# Patient Record
Sex: Female | Born: 1955 | Race: White | Hispanic: No | Smoking: Never smoker
Health system: Southern US, Community
[De-identification: ages and names within clinical notes are randomized; demographics above are authoritative.]

## PROBLEM LIST (undated history)

## (undated) DIAGNOSIS — Z923 Personal history of irradiation: Secondary | ICD-10-CM

## (undated) DIAGNOSIS — D219 Benign neoplasm of connective and other soft tissue, unspecified: Secondary | ICD-10-CM

## (undated) DIAGNOSIS — C801 Malignant (primary) neoplasm, unspecified: Secondary | ICD-10-CM

## (undated) DIAGNOSIS — Z86010 Personal history of colonic polyps: Principal | ICD-10-CM

## (undated) DIAGNOSIS — C50919 Malignant neoplasm of unspecified site of unspecified female breast: Secondary | ICD-10-CM

## (undated) DIAGNOSIS — Z9221 Personal history of antineoplastic chemotherapy: Secondary | ICD-10-CM

## (undated) DIAGNOSIS — T7840XA Allergy, unspecified, initial encounter: Secondary | ICD-10-CM

## (undated) HISTORY — DX: Personal history of colonic polyps: Z86.010

## (undated) HISTORY — DX: Allergy, unspecified, initial encounter: T78.40XA

## (undated) HISTORY — DX: Malignant (primary) neoplasm, unspecified: C80.1

## (undated) HISTORY — PX: TRIGGER FINGER RELEASE: SHX641

## (undated) HISTORY — PX: COLONOSCOPY: SHX174

## (undated) HISTORY — DX: Benign neoplasm of connective and other soft tissue, unspecified: D21.9

## (undated) HISTORY — PX: BREAST BIOPSY: SHX20

## (undated) HISTORY — PX: POLYPECTOMY: SHX149

## (undated) HISTORY — DX: Malignant neoplasm of unspecified site of unspecified female breast: C50.919

---

## 1997-12-23 ENCOUNTER — Ambulatory Visit (HOSPITAL_COMMUNITY): Admission: RE | Admit: 1997-12-23 | Discharge: 1997-12-23 | Payer: Self-pay | Admitting: *Deleted

## 1998-05-27 ENCOUNTER — Other Ambulatory Visit: Admission: RE | Admit: 1998-05-27 | Discharge: 1998-05-27 | Payer: Self-pay | Admitting: *Deleted

## 1998-11-19 ENCOUNTER — Other Ambulatory Visit: Admission: RE | Admit: 1998-11-19 | Discharge: 1998-11-19 | Payer: Self-pay | Admitting: *Deleted

## 1999-01-05 ENCOUNTER — Ambulatory Visit (HOSPITAL_COMMUNITY): Admission: RE | Admit: 1999-01-05 | Discharge: 1999-01-05 | Payer: Self-pay | Admitting: *Deleted

## 1999-06-20 ENCOUNTER — Encounter: Payer: Self-pay | Admitting: Emergency Medicine

## 1999-06-20 ENCOUNTER — Emergency Department (HOSPITAL_COMMUNITY): Admission: EM | Admit: 1999-06-20 | Discharge: 1999-06-20 | Payer: Self-pay | Admitting: Emergency Medicine

## 1999-12-31 ENCOUNTER — Other Ambulatory Visit: Admission: RE | Admit: 1999-12-31 | Discharge: 1999-12-31 | Payer: Self-pay | Admitting: Obstetrics and Gynecology

## 2000-01-12 ENCOUNTER — Encounter: Payer: Self-pay | Admitting: Obstetrics and Gynecology

## 2000-01-12 ENCOUNTER — Ambulatory Visit (HOSPITAL_COMMUNITY): Admission: RE | Admit: 2000-01-12 | Discharge: 2000-01-12 | Payer: Self-pay | Admitting: Obstetrics and Gynecology

## 2000-12-19 ENCOUNTER — Ambulatory Visit (HOSPITAL_COMMUNITY): Admission: RE | Admit: 2000-12-19 | Discharge: 2000-12-19 | Payer: Self-pay | Admitting: Obstetrics and Gynecology

## 2000-12-19 ENCOUNTER — Encounter: Payer: Self-pay | Admitting: Obstetrics and Gynecology

## 2000-12-29 ENCOUNTER — Other Ambulatory Visit: Admission: RE | Admit: 2000-12-29 | Discharge: 2000-12-29 | Payer: Self-pay | Admitting: Obstetrics and Gynecology

## 2002-03-12 ENCOUNTER — Encounter: Payer: Self-pay | Admitting: Obstetrics and Gynecology

## 2002-03-12 ENCOUNTER — Ambulatory Visit: Admission: RE | Admit: 2002-03-12 | Discharge: 2002-03-12 | Payer: Self-pay | Admitting: Obstetrics and Gynecology

## 2002-03-12 ENCOUNTER — Other Ambulatory Visit: Admission: RE | Admit: 2002-03-12 | Discharge: 2002-03-12 | Payer: Self-pay | Admitting: Obstetrics and Gynecology

## 2003-03-28 ENCOUNTER — Encounter: Payer: Self-pay | Admitting: Obstetrics and Gynecology

## 2003-03-28 ENCOUNTER — Ambulatory Visit (HOSPITAL_COMMUNITY): Admission: RE | Admit: 2003-03-28 | Discharge: 2003-03-28 | Payer: Self-pay | Admitting: Obstetrics and Gynecology

## 2003-04-09 ENCOUNTER — Other Ambulatory Visit: Admission: RE | Admit: 2003-04-09 | Discharge: 2003-04-09 | Payer: Self-pay | Admitting: Obstetrics and Gynecology

## 2003-04-10 ENCOUNTER — Encounter: Payer: Self-pay | Admitting: Obstetrics and Gynecology

## 2003-04-10 ENCOUNTER — Encounter: Admission: RE | Admit: 2003-04-10 | Discharge: 2003-04-10 | Payer: Self-pay | Admitting: Obstetrics and Gynecology

## 2004-04-10 ENCOUNTER — Ambulatory Visit (HOSPITAL_COMMUNITY): Admission: RE | Admit: 2004-04-10 | Discharge: 2004-04-10 | Payer: Self-pay | Admitting: Obstetrics and Gynecology

## 2005-06-02 ENCOUNTER — Ambulatory Visit (HOSPITAL_COMMUNITY): Admission: RE | Admit: 2005-06-02 | Discharge: 2005-06-02 | Payer: Self-pay | Admitting: Obstetrics and Gynecology

## 2005-07-05 DIAGNOSIS — C50919 Malignant neoplasm of unspecified site of unspecified female breast: Secondary | ICD-10-CM

## 2005-07-05 HISTORY — PX: BREAST LUMPECTOMY: SHX2

## 2005-07-05 HISTORY — DX: Malignant neoplasm of unspecified site of unspecified female breast: C50.919

## 2006-02-07 ENCOUNTER — Encounter (INDEPENDENT_AMBULATORY_CARE_PROVIDER_SITE_OTHER): Payer: Self-pay | Admitting: *Deleted

## 2006-02-07 ENCOUNTER — Encounter: Admission: RE | Admit: 2006-02-07 | Discharge: 2006-02-07 | Payer: Self-pay | Admitting: Obstetrics and Gynecology

## 2006-02-07 ENCOUNTER — Encounter (INDEPENDENT_AMBULATORY_CARE_PROVIDER_SITE_OTHER): Payer: Self-pay | Admitting: Diagnostic Radiology

## 2006-02-08 ENCOUNTER — Encounter: Admission: RE | Admit: 2006-02-08 | Discharge: 2006-02-08 | Payer: Self-pay | Admitting: Obstetrics and Gynecology

## 2006-02-13 ENCOUNTER — Encounter: Admission: RE | Admit: 2006-02-13 | Discharge: 2006-02-13 | Payer: Self-pay | Admitting: Obstetrics and Gynecology

## 2006-02-23 ENCOUNTER — Encounter: Admission: RE | Admit: 2006-02-23 | Discharge: 2006-02-23 | Payer: Self-pay | Admitting: Surgery

## 2006-02-25 ENCOUNTER — Encounter (INDEPENDENT_AMBULATORY_CARE_PROVIDER_SITE_OTHER): Payer: Self-pay | Admitting: Specialist

## 2006-02-25 ENCOUNTER — Encounter: Admission: RE | Admit: 2006-02-25 | Discharge: 2006-02-25 | Payer: Self-pay | Admitting: Surgery

## 2006-02-25 ENCOUNTER — Ambulatory Visit (HOSPITAL_BASED_OUTPATIENT_CLINIC_OR_DEPARTMENT_OTHER): Admission: RE | Admit: 2006-02-25 | Discharge: 2006-02-25 | Payer: Self-pay | Admitting: Surgery

## 2006-03-08 ENCOUNTER — Ambulatory Visit: Payer: Self-pay | Admitting: Oncology

## 2006-03-16 LAB — COMPREHENSIVE METABOLIC PANEL
Alkaline Phosphatase: 62 U/L (ref 39–117)
BUN: 12 mg/dL (ref 6–23)
CO2: 24 mEq/L (ref 19–32)
Creatinine, Ser: 0.85 mg/dL (ref 0.40–1.20)
Glucose, Bld: 116 mg/dL — ABNORMAL HIGH (ref 70–99)
Sodium: 140 mEq/L (ref 135–145)
Total Bilirubin: 0.5 mg/dL (ref 0.3–1.2)

## 2006-03-16 LAB — CBC WITH DIFFERENTIAL/PLATELET
Basophils Absolute: 0 10*3/uL (ref 0.0–0.1)
EOS%: 0.3 % (ref 0.0–7.0)
Eosinophils Absolute: 0 10*3/uL (ref 0.0–0.5)
HGB: 13.9 g/dL (ref 11.6–15.9)
LYMPH%: 26.5 % (ref 14.0–48.0)
MCH: 33.9 pg (ref 26.0–34.0)
MCV: 95.9 fL (ref 81.0–101.0)
MONO%: 6.7 % (ref 0.0–13.0)
NEUT#: 3.2 10*3/uL (ref 1.5–6.5)
Platelets: 276 10*3/uL (ref 145–400)
RDW: 12 % (ref 11.3–14.5)

## 2006-03-16 LAB — LACTATE DEHYDROGENASE: LDH: 128 U/L (ref 94–250)

## 2006-03-22 ENCOUNTER — Ambulatory Visit: Admission: EM | Admit: 2006-03-22 | Discharge: 2006-03-22 | Payer: Self-pay | Admitting: Oncology

## 2006-03-22 ENCOUNTER — Encounter (INDEPENDENT_AMBULATORY_CARE_PROVIDER_SITE_OTHER): Payer: Self-pay | Admitting: Cardiology

## 2006-03-24 ENCOUNTER — Ambulatory Visit (HOSPITAL_COMMUNITY): Admission: RE | Admit: 2006-03-24 | Discharge: 2006-03-24 | Payer: Self-pay | Admitting: Oncology

## 2006-03-25 ENCOUNTER — Ambulatory Visit (HOSPITAL_COMMUNITY): Admission: RE | Admit: 2006-03-25 | Discharge: 2006-03-25 | Payer: Self-pay | Admitting: Oncology

## 2006-04-18 ENCOUNTER — Ambulatory Visit: Payer: Self-pay | Admitting: Oncology

## 2006-05-03 LAB — CBC WITH DIFFERENTIAL/PLATELET
Basophils Absolute: 0 10*3/uL (ref 0.0–0.1)
EOS%: 0.8 % (ref 0.0–7.0)
Eosinophils Absolute: 0 10*3/uL (ref 0.0–0.5)
HCT: 29.5 % — ABNORMAL LOW (ref 34.8–46.6)
HGB: 10.5 g/dL — ABNORMAL LOW (ref 11.6–15.9)
MCH: 34.2 pg — ABNORMAL HIGH (ref 26.0–34.0)
MCV: 96.2 fL (ref 81.0–101.0)
MONO%: 38.8 % — ABNORMAL HIGH (ref 0.0–13.0)
NEUT#: 0.2 10*3/uL — CL (ref 1.5–6.5)
NEUT%: 25.9 % — ABNORMAL LOW (ref 39.6–76.8)
lymph#: 0.2 10*3/uL — ABNORMAL LOW (ref 0.9–3.3)

## 2006-05-09 LAB — CBC WITH DIFFERENTIAL/PLATELET
Basophils Absolute: 0 10*3/uL (ref 0.0–0.1)
EOS%: 0 % (ref 0.0–7.0)
Eosinophils Absolute: 0 10*3/uL (ref 0.0–0.5)
HCT: 29.3 % — ABNORMAL LOW (ref 34.8–46.6)
HGB: 10.3 g/dL — ABNORMAL LOW (ref 11.6–15.9)
MCH: 34.6 pg — ABNORMAL HIGH (ref 26.0–34.0)
MONO#: 0.6 10*3/uL (ref 0.1–0.9)
NEUT#: 9.9 10*3/uL — ABNORMAL HIGH (ref 1.5–6.5)
NEUT%: 90.2 % — ABNORMAL HIGH (ref 39.6–76.8)
RDW: 13.9 % (ref 11.3–14.5)
WBC: 11 10*3/uL — ABNORMAL HIGH (ref 3.9–10.0)
lymph#: 0.4 10*3/uL — ABNORMAL LOW (ref 0.9–3.3)

## 2006-05-17 LAB — COMPREHENSIVE METABOLIC PANEL
Albumin: 4 g/dL (ref 3.5–5.2)
Alkaline Phosphatase: 102 U/L (ref 39–117)
BUN: 10 mg/dL (ref 6–23)
Calcium: 9.4 mg/dL (ref 8.4–10.5)
Creatinine, Ser: 0.72 mg/dL (ref 0.40–1.20)
Glucose, Bld: 96 mg/dL (ref 70–99)
Potassium: 4.3 mEq/L (ref 3.5–5.3)

## 2006-05-17 LAB — CBC WITH DIFFERENTIAL/PLATELET
Basophils Absolute: 0 10*3/uL (ref 0.0–0.1)
EOS%: 0.2 % (ref 0.0–7.0)
Eosinophils Absolute: 0 10*3/uL (ref 0.0–0.5)
HCT: 24.2 % — ABNORMAL LOW (ref 34.8–46.6)
HGB: 8.7 g/dL — ABNORMAL LOW (ref 11.6–15.9)
MCH: 32.8 pg (ref 26.0–34.0)
MCV: 90.9 fL (ref 81.0–101.0)
MONO%: 17.5 % — ABNORMAL HIGH (ref 0.0–13.0)
NEUT#: 0.2 10*3/uL — CL (ref 1.5–6.5)
NEUT%: 35.4 % — ABNORMAL LOW (ref 39.6–76.8)
Platelets: 226 10*3/uL (ref 145–400)
RDW: 13.2 % (ref 11.3–14.5)

## 2006-05-24 LAB — CBC WITH DIFFERENTIAL/PLATELET
Basophils Absolute: 0 10*3/uL (ref 0.0–0.1)
Eosinophils Absolute: 0 10*3/uL (ref 0.0–0.5)
HCT: 29.2 % — ABNORMAL LOW (ref 34.8–46.6)
HGB: 10.1 g/dL — ABNORMAL LOW (ref 11.6–15.9)
MCV: 99.7 fL (ref 81.0–101.0)
NEUT#: 11.4 10*3/uL — ABNORMAL HIGH (ref 1.5–6.5)
NEUT%: 87.5 % — ABNORMAL HIGH (ref 39.6–76.8)
RDW: 18.7 % — ABNORMAL HIGH (ref 11.3–14.5)
lymph#: 0.4 10*3/uL — ABNORMAL LOW (ref 0.9–3.3)

## 2006-05-31 LAB — CBC WITH DIFFERENTIAL/PLATELET
Basophils Absolute: 0 10*3/uL (ref 0.0–0.1)
Eosinophils Absolute: 0 10*3/uL (ref 0.0–0.5)
HCT: 34.4 % — ABNORMAL LOW (ref 34.8–46.6)
HGB: 11.7 g/dL (ref 11.6–15.9)
MONO#: 0.5 10*3/uL (ref 0.1–0.9)
NEUT#: 3 10*3/uL (ref 1.5–6.5)
NEUT%: 75.3 % (ref 39.6–76.8)
RDW: 18.4 % — ABNORMAL HIGH (ref 11.3–14.5)
WBC: 4 10*3/uL (ref 3.9–10.0)
lymph#: 0.5 10*3/uL — ABNORMAL LOW (ref 0.9–3.3)

## 2006-06-01 ENCOUNTER — Ambulatory Visit: Payer: Self-pay | Admitting: Oncology

## 2006-06-07 LAB — CBC WITH DIFFERENTIAL/PLATELET
Basophils Absolute: 0 10*3/uL (ref 0.0–0.1)
Eosinophils Absolute: 0 10*3/uL (ref 0.0–0.5)
HCT: 33.4 % — ABNORMAL LOW (ref 34.8–46.6)
HGB: 11.3 g/dL — ABNORMAL LOW (ref 11.6–15.9)
LYMPH%: 18.1 % (ref 14.0–48.0)
MCV: 98.4 fL (ref 81.0–101.0)
MONO%: 11.7 % (ref 0.0–13.0)
NEUT#: 2 10*3/uL (ref 1.5–6.5)
NEUT%: 68.4 % (ref 39.6–76.8)
Platelets: 294 10*3/uL (ref 145–400)
RBC: 3.39 10*6/uL — ABNORMAL LOW (ref 3.70–5.32)

## 2006-06-13 LAB — CBC WITH DIFFERENTIAL/PLATELET
BASO%: 0.4 % (ref 0.0–2.0)
LYMPH%: 19.4 % (ref 14.0–48.0)
MCH: 34.1 pg — ABNORMAL HIGH (ref 26.0–34.0)
MCHC: 34.4 g/dL (ref 32.0–36.0)
MCV: 99 fL (ref 81.0–101.0)
MONO%: 5.7 % (ref 0.0–13.0)
NEUT%: 72.9 % (ref 39.6–76.8)
Platelets: 261 10*3/uL (ref 145–400)
RBC: 3.56 10*6/uL — ABNORMAL LOW (ref 3.70–5.32)

## 2006-06-21 LAB — CBC WITH DIFFERENTIAL/PLATELET
BASO%: 2 % (ref 0.0–2.0)
EOS%: 1.1 % (ref 0.0–7.0)
LYMPH%: 13.7 % — ABNORMAL LOW (ref 14.0–48.0)
MCH: 33.5 pg (ref 26.0–34.0)
MCHC: 34.9 g/dL (ref 32.0–36.0)
MONO#: 0.2 10*3/uL (ref 0.1–0.9)
RBC: 3.54 10*6/uL — ABNORMAL LOW (ref 3.70–5.32)
WBC: 2.6 10*3/uL — ABNORMAL LOW (ref 3.9–10.0)
lymph#: 0.4 10*3/uL — ABNORMAL LOW (ref 0.9–3.3)

## 2006-07-04 LAB — CBC WITH DIFFERENTIAL/PLATELET
BASO%: 0.4 % (ref 0.0–2.0)
EOS%: 1 % (ref 0.0–7.0)
HCT: 33.8 % — ABNORMAL LOW (ref 34.8–46.6)
MCH: 33.8 pg (ref 26.0–34.0)
MCHC: 34.3 g/dL (ref 32.0–36.0)
MONO#: 0.1 10*3/uL (ref 0.1–0.9)
NEUT%: 76.5 % (ref 39.6–76.8)
RDW: 15 % — ABNORMAL HIGH (ref 11.3–14.5)
WBC: 2.5 10*3/uL — ABNORMAL LOW (ref 3.9–10.0)
lymph#: 0.4 10*3/uL — ABNORMAL LOW (ref 0.9–3.3)

## 2006-07-12 ENCOUNTER — Ambulatory Visit: Payer: Self-pay | Admitting: Oncology

## 2006-07-12 LAB — CBC WITH DIFFERENTIAL/PLATELET
Basophils Absolute: 0 10*3/uL (ref 0.0–0.1)
Eosinophils Absolute: 0 10*3/uL (ref 0.0–0.5)
HGB: 12.1 g/dL (ref 11.6–15.9)
LYMPH%: 20.4 % (ref 14.0–48.0)
MCV: 97.4 fL (ref 81.0–101.0)
MONO#: 0.3 10*3/uL (ref 0.1–0.9)
MONO%: 12.6 % (ref 0.0–13.0)
NEUT#: 1.6 10*3/uL (ref 1.5–6.5)
Platelets: 297 10*3/uL (ref 145–400)
WBC: 2.5 10*3/uL — ABNORMAL LOW (ref 3.9–10.0)

## 2006-07-14 ENCOUNTER — Ambulatory Visit: Admission: RE | Admit: 2006-07-14 | Discharge: 2006-09-24 | Payer: Self-pay | Admitting: *Deleted

## 2006-08-09 LAB — CBC WITH DIFFERENTIAL/PLATELET
Eosinophils Absolute: 0 10*3/uL (ref 0.0–0.5)
HCT: 36.7 % (ref 34.8–46.6)
LYMPH%: 21.7 % (ref 14.0–48.0)
MCV: 92.9 fL (ref 81.0–101.0)
MONO#: 0.3 10*3/uL (ref 0.1–0.9)
MONO%: 9.1 % (ref 0.0–13.0)
NEUT#: 1.9 10*3/uL (ref 1.5–6.5)
NEUT%: 67.1 % (ref 39.6–76.8)
Platelets: 213 10*3/uL (ref 145–400)
RBC: 3.96 10*6/uL (ref 3.70–5.32)

## 2006-08-09 LAB — COMPREHENSIVE METABOLIC PANEL
Alkaline Phosphatase: 62 U/L (ref 39–117)
BUN: 11 mg/dL (ref 6–23)
CO2: 28 mEq/L (ref 19–32)
Creatinine, Ser: 0.67 mg/dL (ref 0.40–1.20)
Glucose, Bld: 84 mg/dL (ref 70–99)
Sodium: 143 mEq/L (ref 135–145)
Total Bilirubin: 0.5 mg/dL (ref 0.3–1.2)
Total Protein: 6.7 g/dL (ref 6.0–8.3)

## 2006-08-09 LAB — CANCER ANTIGEN 27.29: CA 27.29: 22 U/mL (ref 0–39)

## 2006-10-17 ENCOUNTER — Ambulatory Visit: Payer: Self-pay | Admitting: Oncology

## 2006-10-25 IMAGING — CT CT PELVIS W/ CM
1 of 3 series · 13 of 32 positions shown, 18 images · IV contrast (omnipaque)
Comparison: PET CT from today.

CLINICAL DATA: Breast cancer.
 CHEST CT WITH CONTRAST:
TECHNIQUE: Multidetector CT imaging of the chest was performed following the standard protocol during bolus administration of intravenous contrast.
 Contrast:  125 cc Omnipaque 300.
TECHNIQUE: Multidetector CT imaging of the abdomen was performed following the standard protocol during bolus administration of intravenous contrast.
TECHNIQUE: Multidetector CT imaging of the pelvis was performed following the standard protocol during bolus administration of intravenous contrast.

[Series 2: cap 5.0 b40f st · axial · 0.68mm/px · z∈[+756,+1306]mm · 13 of 124 slices shown, 18 images]
[im 7/124  soft-tissue]
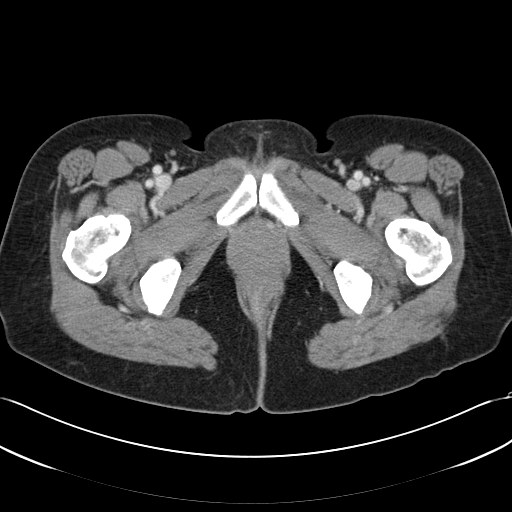
[im 7/124  bone]
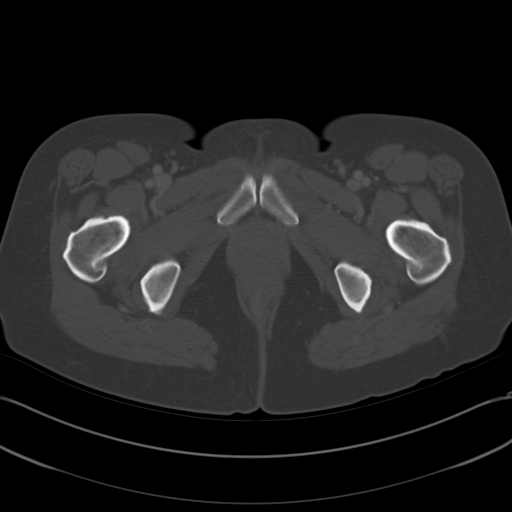
[im 21/124  soft-tissue]
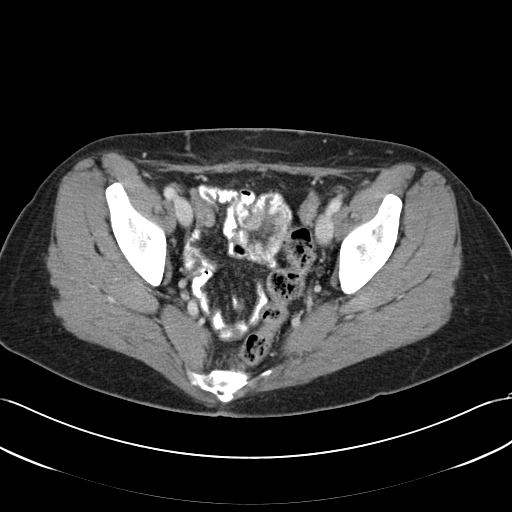
[im 28/124  soft-tissue]
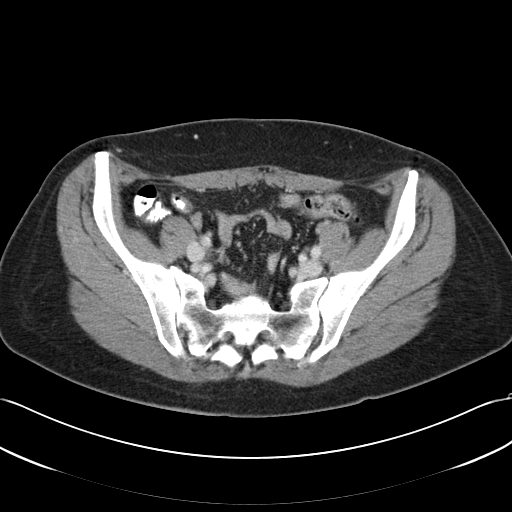
[im 35/124  soft-tissue]
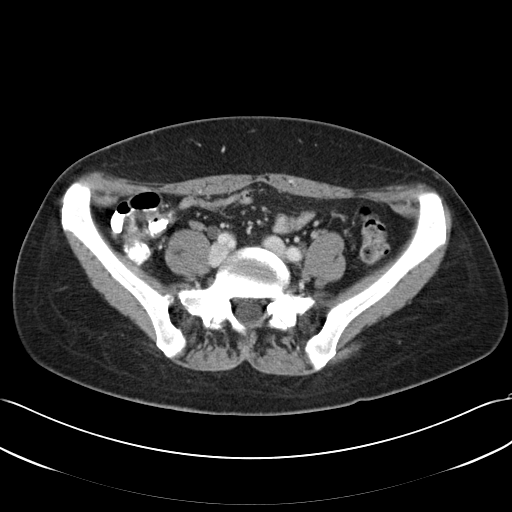
[im 48/124  soft-tissue]
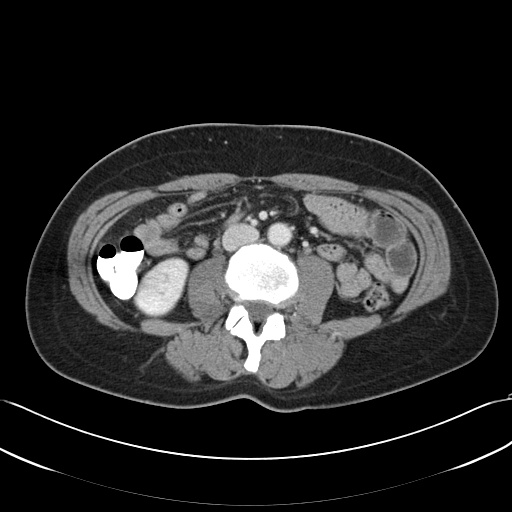
[im 55/124  soft-tissue]
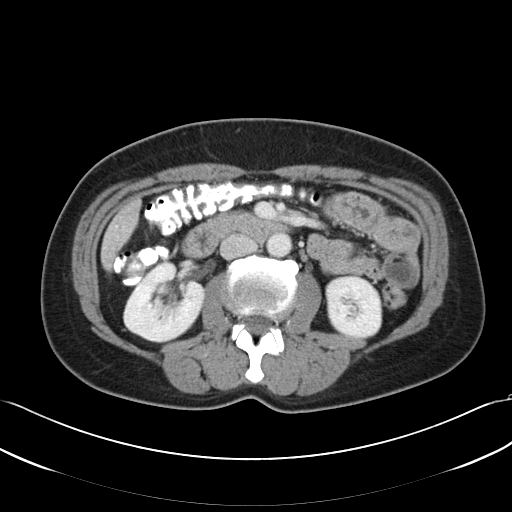
[im 69/124  soft-tissue]
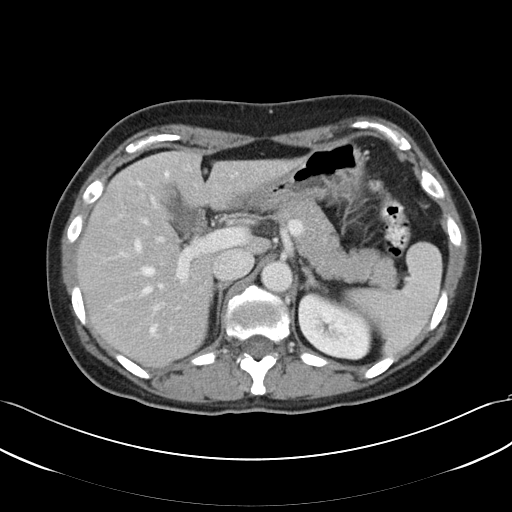
[im 76/124  soft-tissue]
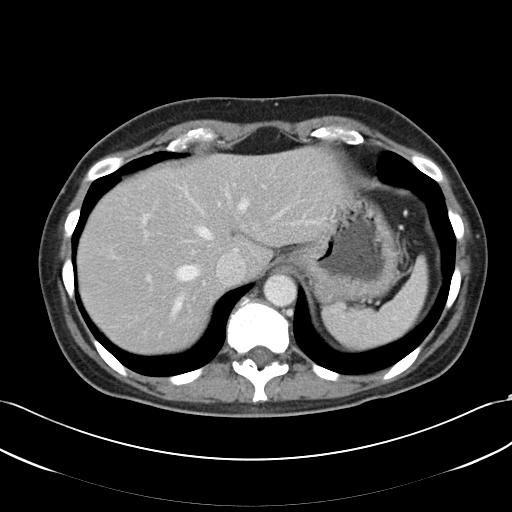
[im 89/124  soft-tissue]
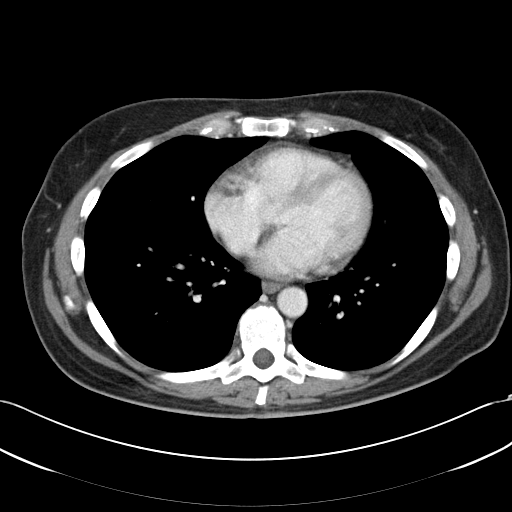
[im 89/124  bone]
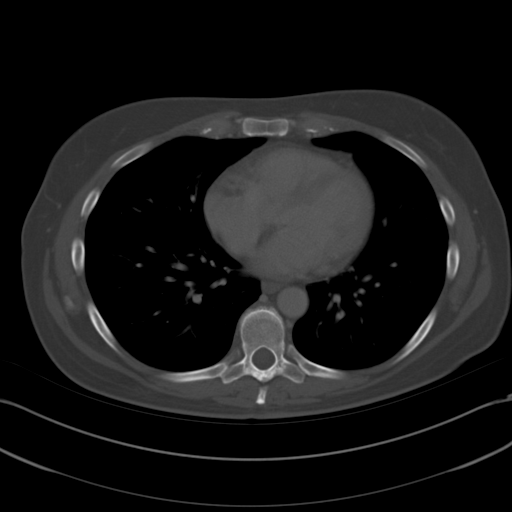
[im 96/124  soft-tissue]
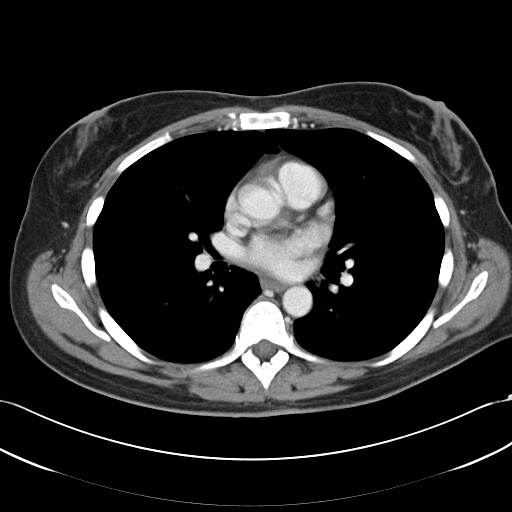
[im 96/124  lung]
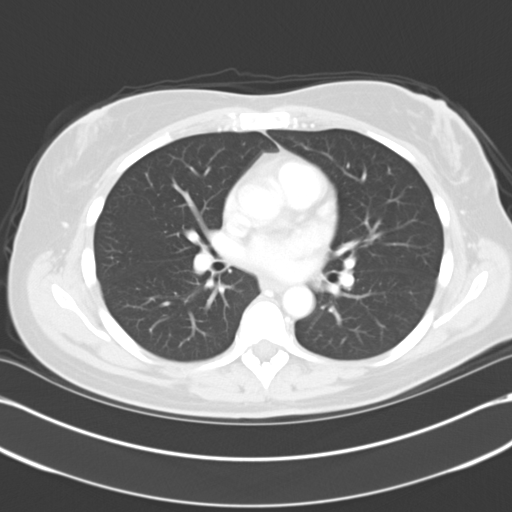
[im 103/124  soft-tissue]
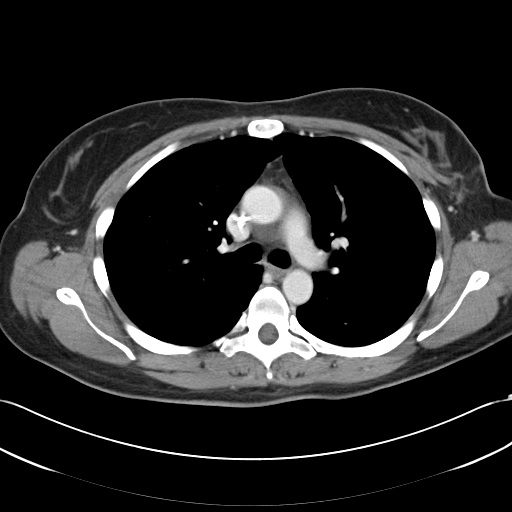
[im 103/124  lung]
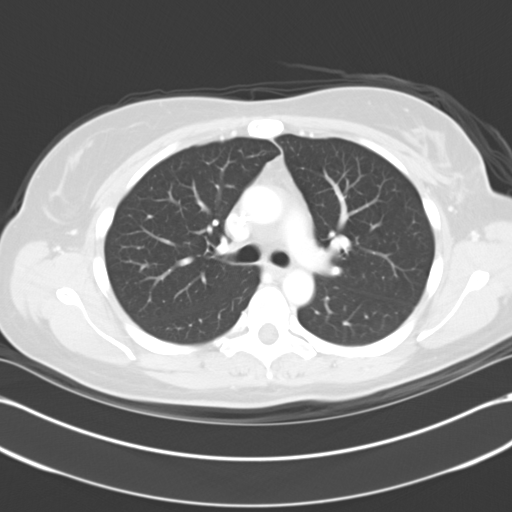
[im 110/124  lung]
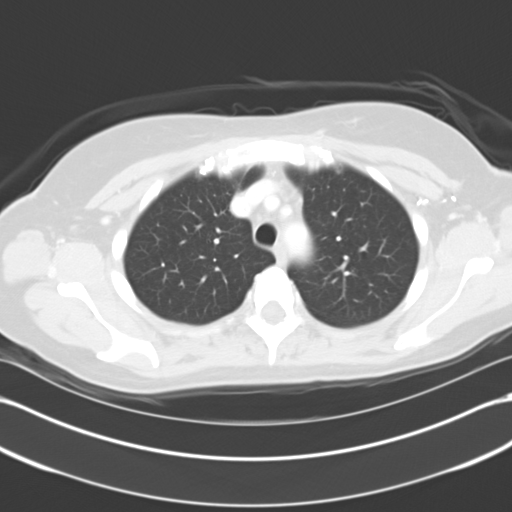
[im 117/124  soft-tissue]
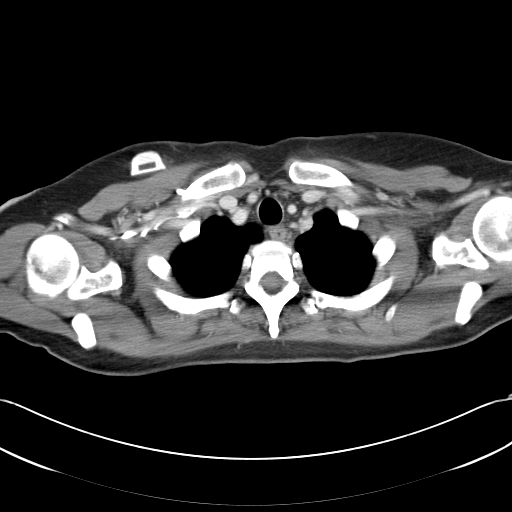
[im 117/124  lung]
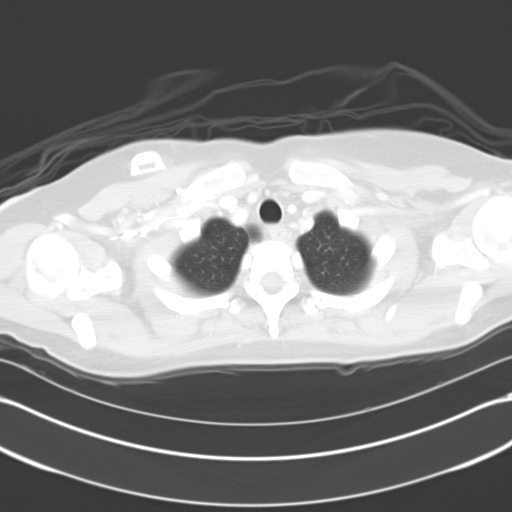

[13 of 32 positions shown; findings below may reference images not displayed]

FINDINGS: Postoperative changes in left breast and left axilla are noted.  
 There are no enlarged right axillary lymph nodes.  
 The thyroid gland is normal in appearance. 
 Negative for mediastinal or hilar lymphadenopathy.
 Negative for pleural or pericardial effusion. 
 Negative for pulmonary nodules or masses.  
 Review of the bone windows shows thoracic spondylosis.  No suspicious lesions are identified.
IMPRESSION: Expected postoperative changes in left breast and left axilla.  No evidence for metastatic disease.
 ABDOMEN CT WITH CONTRAST:
FINDINGS: 4 mm hypodensity in the left lobe of liver adjacent to falciform ligament is nonspecific (image #52).  Additional low density lesion is identified adjacent to the right hepatic vein (image #46).  This measures 5 mm.  No intrahepatic biliary ductal dilatation.
 Gallbladder negative. 
 Pancreas negative.  Spleen negative.  Adrenal glands negative.  
 Right kidney is negative.  
 Left kidney has a tiny hypodensity within the upper pole cortex measuring 5 mm, too small to characterize, but likely a cyst.  Negative for retroperitoneal or mesenteric adenopathy. 
 Bowel loops are negative.
 Bone windows show no lytic or sclerotic lesions.
IMPRESSION: 1.  No definite evidence for abdominal metastasis.
 2.  Nonspecific hypodensities in left kidney and liver are too small to characterize, but likely represent simple cysts.  
 PELVIS CT WITH CONTRAST:
FINDINGS: There are no pathologically-enlarged pelvic or inguinal lymph nodes.  
 Urinary bladder is negative.  The uterus and the adnexal structures are negative.  No free fluid or abscess formation.
 Review of the bone windows shows no lytic or sclerotic lesions.
IMPRESSION: No evidence for pelvic metastasis.

## 2006-11-22 ENCOUNTER — Ambulatory Visit (HOSPITAL_BASED_OUTPATIENT_CLINIC_OR_DEPARTMENT_OTHER): Admission: RE | Admit: 2006-11-22 | Discharge: 2006-11-22 | Payer: Self-pay | Admitting: Surgery

## 2007-02-15 ENCOUNTER — Ambulatory Visit: Payer: Self-pay | Admitting: Oncology

## 2007-02-17 ENCOUNTER — Encounter: Admission: RE | Admit: 2007-02-17 | Discharge: 2007-02-17 | Payer: Self-pay | Admitting: Oncology

## 2007-02-17 LAB — CBC WITH DIFFERENTIAL/PLATELET
BASO%: 0.3 % (ref 0.0–2.0)
Basophils Absolute: 0 10*3/uL (ref 0.0–0.1)
Eosinophils Absolute: 0 10*3/uL (ref 0.0–0.5)
HCT: 35.6 % (ref 34.8–46.6)
HGB: 13 g/dL (ref 11.6–15.9)
LYMPH%: 24.3 % (ref 14.0–48.0)
MCHC: 36.5 g/dL — ABNORMAL HIGH (ref 32.0–36.0)
MONO#: 0.3 10*3/uL (ref 0.1–0.9)
NEUT#: 2.9 10*3/uL (ref 1.5–6.5)
NEUT%: 68.6 % (ref 39.6–76.8)
Platelets: 191 10*3/uL (ref 145–400)
WBC: 4.2 10*3/uL (ref 3.9–10.0)
lymph#: 1 10*3/uL (ref 0.9–3.3)

## 2007-02-18 LAB — COMPREHENSIVE METABOLIC PANEL
AST: 16 U/L (ref 0–37)
Alkaline Phosphatase: 70 U/L (ref 39–117)
BUN: 13 mg/dL (ref 6–23)
Glucose, Bld: 86 mg/dL (ref 70–99)
Total Bilirubin: 0.7 mg/dL (ref 0.3–1.2)

## 2007-02-18 LAB — CANCER ANTIGEN 27.29: CA 27.29: 19 U/mL (ref 0–39)

## 2007-08-15 ENCOUNTER — Encounter: Admission: RE | Admit: 2007-08-15 | Discharge: 2007-08-15 | Payer: Self-pay | Admitting: Oncology

## 2007-08-23 ENCOUNTER — Ambulatory Visit: Payer: Self-pay | Admitting: Oncology

## 2007-08-28 LAB — COMPREHENSIVE METABOLIC PANEL
AST: 16 U/L (ref 0–37)
Albumin: 4.8 g/dL (ref 3.5–5.2)
Alkaline Phosphatase: 69 U/L (ref 39–117)
BUN: 14 mg/dL (ref 6–23)
Potassium: 4 mEq/L (ref 3.5–5.3)

## 2007-08-28 LAB — CBC WITH DIFFERENTIAL/PLATELET
Basophils Absolute: 0 10*3/uL (ref 0.0–0.1)
EOS%: 0.2 % (ref 0.0–7.0)
HGB: 14 g/dL (ref 11.6–15.9)
MCH: 34.2 pg — ABNORMAL HIGH (ref 26.0–34.0)
MCV: 94.8 fL (ref 81.0–101.0)
MONO%: 5.2 % (ref 0.0–13.0)
RBC: 4.09 10*6/uL (ref 3.70–5.32)
RDW: 11.7 % (ref 11.3–14.5)

## 2008-02-18 ENCOUNTER — Ambulatory Visit: Payer: Self-pay | Admitting: Oncology

## 2008-02-20 ENCOUNTER — Encounter: Admission: RE | Admit: 2008-02-20 | Discharge: 2008-02-20 | Payer: Self-pay | Admitting: Oncology

## 2008-02-22 LAB — CBC WITH DIFFERENTIAL/PLATELET
BASO%: 0.6 % (ref 0.0–2.0)
EOS%: 1 % (ref 0.0–7.0)
HGB: 12.9 g/dL (ref 11.6–15.9)
MCH: 33.8 pg (ref 26.0–34.0)
MCHC: 35.3 g/dL (ref 32.0–36.0)
RDW: 12.2 % (ref 11.3–14.5)
lymph#: 1.4 10*3/uL (ref 0.9–3.3)

## 2008-02-22 LAB — COMPREHENSIVE METABOLIC PANEL
ALT: 11 U/L (ref 0–35)
AST: 12 U/L (ref 0–37)
Albumin: 4.6 g/dL (ref 3.5–5.2)
Calcium: 9.2 mg/dL (ref 8.4–10.5)
Chloride: 103 mEq/L (ref 96–112)
Potassium: 3.8 mEq/L (ref 3.5–5.3)

## 2008-02-23 ENCOUNTER — Encounter (HOSPITAL_COMMUNITY): Admission: RE | Admit: 2008-02-23 | Discharge: 2008-03-21 | Payer: Self-pay | Admitting: Oncology

## 2009-02-18 ENCOUNTER — Ambulatory Visit: Payer: Self-pay | Admitting: Oncology

## 2009-02-20 ENCOUNTER — Encounter: Admission: RE | Admit: 2009-02-20 | Discharge: 2009-02-20 | Payer: Self-pay | Admitting: Oncology

## 2009-02-20 LAB — CBC WITH DIFFERENTIAL/PLATELET
BASO%: 0.7 % (ref 0.0–2.0)
Eosinophils Absolute: 0 10*3/uL (ref 0.0–0.5)
HCT: 36.6 % (ref 34.8–46.6)
HGB: 12.8 g/dL (ref 11.6–15.9)
MCHC: 34.8 g/dL (ref 31.5–36.0)
MONO#: 0.3 10*3/uL (ref 0.1–0.9)
NEUT#: 1.6 10*3/uL (ref 1.5–6.5)
NEUT%: 50.6 % (ref 38.4–76.8)
WBC: 3.2 10*3/uL — ABNORMAL LOW (ref 3.9–10.3)
lymph#: 1.3 10*3/uL (ref 0.9–3.3)

## 2009-02-20 LAB — COMPREHENSIVE METABOLIC PANEL
ALT: 11 U/L (ref 0–35)
CO2: 29 mEq/L (ref 19–32)
Calcium: 9.5 mg/dL (ref 8.4–10.5)
Chloride: 102 mEq/L (ref 96–112)
Creatinine, Ser: 0.72 mg/dL (ref 0.40–1.20)
Glucose, Bld: 111 mg/dL — ABNORMAL HIGH (ref 70–99)
Total Protein: 6.7 g/dL (ref 6.0–8.3)

## 2009-02-20 LAB — CANCER ANTIGEN 27.29: CA 27.29: 26 U/mL (ref 0–39)

## 2010-02-23 ENCOUNTER — Encounter: Admission: RE | Admit: 2010-02-23 | Discharge: 2010-02-23 | Payer: Self-pay | Admitting: Oncology

## 2010-02-24 ENCOUNTER — Ambulatory Visit: Payer: Self-pay | Admitting: Oncology

## 2010-02-26 LAB — COMPREHENSIVE METABOLIC PANEL
AST: 17 U/L (ref 0–37)
Albumin: 4.7 g/dL (ref 3.5–5.2)
BUN: 12 mg/dL (ref 6–23)
CO2: 23 mEq/L (ref 19–32)
Calcium: 9.2 mg/dL (ref 8.4–10.5)
Chloride: 103 mEq/L (ref 96–112)
Sodium: 140 mEq/L (ref 135–145)

## 2010-08-28 ENCOUNTER — Encounter (INDEPENDENT_AMBULATORY_CARE_PROVIDER_SITE_OTHER): Payer: Self-pay | Admitting: *Deleted

## 2010-09-01 NOTE — Letter (Signed)
Summary: Pre Visit Letter Revised  [facility]  [address] [place], [place] [address]   Phone: [phone]  Fax: [phone]        08/28/2010 MRN: [id number] Karen Graham [address] [place], [place]  [address]             Procedure Date: 10/08/2010 @ 8:30   Direct colon-Dr. Leone Payor   Welcome to the Gastroenterology Division at [facility].    You are scheduled to see a nurse for your pre-procedure visit on 09/29/2010 at 1:30 on the 3rd floor at [facility], [address]  We ask that you try to arrive at our office 15 minutes prior to your appointment time to allow for check-in.  Please take a minute to review the attached form.  If you answer "Yes" to one or more of the questions on the first page, we ask that you call the person listed at your earliest opportunity.  If you answer "No" to all of the questions, please complete the rest of the form and bring it to your appointment.    Your nurse visit will consist of discussing your medical and surgical history, your immediate family medical history, and your medications.   If you are unable to list all of your medications on the form, please bring the medication bottles to your appointment and we will list them.  We will need to be aware of both prescribed and over the counter drugs.  We will need to know exact dosage information as well.    Please be prepared to read and sign documents such as consent forms, a financial agreement, and acknowledgement forms.  If necessary, and with your consent, a friend or relative is welcome to sit-in on the nurse visit with you.  Please bring your insurance card so that we may make a copy of it.  If your insurance requires a referral to see a specialist, please bring your referral form from your primary care physician.  No co-pay is required for this nurse visit.     If you cannot keep your appointment, please call [phone] to cancel or reschedule prior to your  appointment date.  This allows [place] the opportunity to schedule an appointment for another patient in need of care.    Thank you for choosing [facility] for your medical needs.  We appreciate the opportunity to care for you.  Please visit [place] at our website  to learn more about our practice.  Sincerely, The Gastroenterology Division

## 2010-09-29 ENCOUNTER — Ambulatory Visit (AMBULATORY_SURGERY_CENTER): Payer: BC Managed Care – PPO

## 2010-09-29 VITALS — Ht 66.0 in | Wt 145.4 lb

## 2010-09-29 DIAGNOSIS — Z1211 Encounter for screening for malignant neoplasm of colon: Secondary | ICD-10-CM

## 2010-09-29 DIAGNOSIS — R110 Nausea: Secondary | ICD-10-CM

## 2010-10-01 ENCOUNTER — Telehealth: Payer: Self-pay | Admitting: Internal Medicine

## 2010-10-01 MED ORDER — PEG-KCL-NACL-NASULF-NA ASC-C 100 G PO SOLR: 1.0000 | Freq: Once | ORAL | Status: AC

## 2010-10-01 NOTE — Telephone Encounter (Signed)
Rx for Moviprep sent to [facility] on [address].  Patient notified.

## 2010-10-05 DIAGNOSIS — Z860101 Personal history of adenomatous and serrated colon polyps: Secondary | ICD-10-CM

## 2010-10-05 DIAGNOSIS — Z86010 Personal history of colonic polyps: Secondary | ICD-10-CM

## 2010-10-05 HISTORY — DX: Personal history of adenomatous and serrated colon polyps: Z86.0101

## 2010-10-05 HISTORY — DX: Personal history of colonic polyps: Z86.010

## 2010-10-07 ENCOUNTER — Encounter: Payer: Self-pay | Admitting: Internal Medicine

## 2010-10-08 ENCOUNTER — Ambulatory Visit (AMBULATORY_SURGERY_CENTER): Payer: BC Managed Care – PPO | Admitting: Internal Medicine

## 2010-10-08 ENCOUNTER — Encounter: Payer: Self-pay | Admitting: Internal Medicine

## 2010-10-08 VITALS — BP 143/80 | HR 100 | Temp 98.4°F | Resp 18 | Ht 66.0 in | Wt 143.0 lb

## 2010-10-08 DIAGNOSIS — D126 Benign neoplasm of colon, unspecified: Secondary | ICD-10-CM

## 2010-10-08 DIAGNOSIS — K644 Residual hemorrhoidal skin tags: Secondary | ICD-10-CM

## 2010-10-08 DIAGNOSIS — K5730 Diverticulosis of large intestine without perforation or abscess without bleeding: Secondary | ICD-10-CM

## 2010-10-08 DIAGNOSIS — K635 Polyp of colon: Secondary | ICD-10-CM

## 2010-10-08 DIAGNOSIS — Z1211 Encounter for screening for malignant neoplasm of colon: Secondary | ICD-10-CM

## 2010-10-08 MED ORDER — SODIUM CHLORIDE 0.9 % IV SOLN: 500.0000 mL | INTRAVENOUS | Status: DC

## 2010-10-08 NOTE — Progress Notes (Signed)
Diverticulosis, polyp int hemorrhoids

## 2010-10-08 NOTE — Patient Instructions (Addendum)
The colonoscopy showed one small (4mm) polyp that was removed. It also showed mild diverticulosis and small hemorrhoids.  Hemorrhoids Hemorrhoids are dilated (enlarged) veins around the rectum. Sometimes clots will form in the veins. This makes them swollen and painful. These are called thrombosed hemorrhoids. Causes of hemorrhoids include:  Pregnancy: this increases the pressure in the hemorrhoidal veins.   Constipation.   Straining to have a bowel movement.  HOME CARE INSTRUCTIONS  Eat a well balanced diet and drink 6 to 8 glasses of water every day to avoid constipation. You may also use a bulk laxative.   Avoid straining to have bowel movements.   Keep anal area dry and clean.   Only take over-the-counter or prescription medicines for pain, discomfort, or fever as directed by your caregiver.  If thrombosed:  Take hot sitz baths for 20 to 30 minutes, 3 to 4 times per day.   If the hemorrhoids are very tender and swollen, place ice packs on area as tolerated. Using ice packs between sitz baths may be helpful. Fill a plastic bag with ice and use a towel between the bag of ice and your skin.   Special creams and suppositories (Anusol, Nupercainal, Wyanoids) may be used or applied as directed.   Do not use a donut shaped pillow or sit on the toilet for long periods. This increases blood pooling and pain.   Move your bowels when your body has the urge; this will require less straining and will decrease pain and pressure.   Only take over-the-counter or prescription medicines for pain, discomfort, or fever as directed by your caregiver.  SEEK MEDICAL CARE IF:  You have increasing pain and swelling that is not controlled with your prescription.   You have uncontrolled bleeding.   You have an inability or difficulty having a bowel movement.   You have pain or inflammation outside the area of the hemorrhoids.   You have chills and/or an oral temperature above 100.5 F that lasts  for 2 days or longer, or as your caregiver suggests.  MAKE SURE YOU:   Understand these instructions.   Will watch your condition.   Will get help right away if you are not doing well or get worse.  Document Released: 06/18/2000 Document Re-Released: 06/03/2008 [employer] Patient Information 2011 [employer]hoids.Diverticulosis Diverticulosis is a common condition that develops when small pouches (diverticula) form in the wall of the colon. The risk of diverticulosis increases with age. It happens more often in people who eat a low-fiber diet. Most individuals with diverticulosis have no symptoms. Those individuals with symptoms usually experience belly (abdominal) pain, constipation, or loose stools (diarrhea). HOME CARE INSTRUCTIONS  Increase the amount of fiber in your diet as directed by your caregiver or dietician. This may reduce symptoms of diverticulosis.   Your caregiver may recommend taking a dietary fiber supplement.   Drink at least 6 to 8 glasses of water each day to prevent constipation.   Try not to strain when you have a bowel movement.   Your caregiver may recommend avoiding nuts and seeds to prevent complications, although this is still an uncertain benefit.   Only take over-the-counter or prescription medicines for pain, discomfort, or fever as directed by your caregiver.  FOODS HAVING HIGH FIBER CONTENT INCLUDE:  Fruits. Apple, peach, pear, tangerine, raisins, prunes.   Vegetables. Brussels sprouts, asparagus, broccoli, cabbage, carrot, cauliflower, romaine lettuce, spinach, summer squash, tomato, winter squash, zucchini.   Starchy Vegetables. Baked beans, kidney beans, lima beans, split  peas, lentils, potatoes (with skin).   Grains. Whole wheat bread, brown rice, bran flake cereal, plain oatmeal, white rice, shredded wheat, bran muffins.  SEEK IMMEDIATE MEDICAL CARE IF:  You develop increasing pain or severe bloating.   You have an oral temperature above  100.5 F, not controlled by medicine.   You develop vomiting or bowel movements that are bloody or black.  Document Released: 03/18/2004 Document Re-Released: 12/09/2009 [facility] Patient Information 2011 [place], [place].Polyps, Colon  A polyp is extra tissue that grows inside your body. Colon polyps grow in the large intestine. The large intestine, also called the colon, is part of your digestive system. It is a long, hollow tube at the end of your digestive tract where your body makes and stores stool. Most polyps are not dangerous. They are benign. This means they are not cancerous. But over time, some types of polyps can turn into cancer. Polyps that are smaller than a pea are usually not harmful. But larger polyps could someday become or may already be cancerous. To be safe, doctors remove all polyps and test them.  WHO GETS POLYPS? Anyone can get polyps, but certain people are more likely than others. You may have a greater chance of getting polyps if:  You are over 50.   You have had polyps before.   Someone in your family has had polyps.   Someone in your family has had cancer of the large intestine.   Find out if someone in your family has had polyps. You may also be more likely to get polyps if you:   Eat a lot of fatty foods   Smoke   Drink alcohol   Do not exercise  Eat too much  SYMPTOMS Most small polyps do not cause symptoms. People often do not know they have one until their caregiver finds it during a regular checkup or while testing them for something else. Some people do have symptoms like these:  Bleeding from the anus. You might notice blood on your underwear or on toilet paper after you have had a bowel movement.   Constipation or diarrhea that lasts more than a week.   Blood in the stool. Blood can make stool look black or it can show up as red streaks in the stool.  If you have any of these symptoms, see your caregiver. HOW DOES THE DOCTOR TEST FOR  POLYPS? The doctor can use four tests to check for polyps:  Digital rectal exam. The caregiver wears gloves and checks your rectum (the last part of the large intestine) to see if it feels normal. This test would find polyps only in the rectum. Your caregiver may need to do one of the other tests listed below to find polyps higher up in the intestine.   Barium enema. The caregiver puts a liquid called barium into your rectum before taking x-rays of your large intestine. Barium makes your intestine look white in the pictures. Polyps are dark, so they are easy to see.   Sigmoidoscopy. With this test, the caregiver can see inside your large intestine. A thin flexible tube is placed into your rectum. The device is called a sigmoidoscope, which has a light and a tiny video camera in it. The caregiver uses the sigmoidoscope to look at the last third of your large intestine.   Colonoscopy. This test is like sigmoidoscopy, but the caregiver looks at all of the large intestine. It usually requires sedation. This is the most common method for finding  and removing polyps.  TREATMENT  The caregiver will remove the polyp during sigmoidoscopy or colonoscopy. The polyp is then tested for cancer.   If you have had polyps, your caregiver may want you to get tested regularly in the future.  PREVENTION There is not one sure way to prevent polyps. You might be able to lower your risk of getting them if you:  Eat more fruits and vegetables and less fatty food.   Do not smoke.   Avoid alcohol.   Exercise every day.   Lose weight if you are overweight.   Eating more calcium and folate can also lower your risk of getting polyps. Some foods that are rich in calcium are milk, cheese, and broccoli. Some foods that are rich in folate are chickpeas, kidney beans, and spinach.   Aspirin might help prevent polyps. Studies are under way.  Document Released: 03/17/2004 Document Re-Released: 12/09/2009 [facility]  Patient Information 2011 [place], [place].

## 2010-10-12 ENCOUNTER — Telehealth: Payer: Self-pay | Admitting: *Deleted

## 2010-10-12 NOTE — Telephone Encounter (Signed)
NO ANSWER. LEFT MESSAGE TO CALL us IF NEEDED.

## 2010-10-19 ENCOUNTER — Encounter: Payer: Self-pay | Admitting: Internal Medicine

## 2010-10-19 NOTE — Progress Notes (Signed)
Quick Note:  One adenoma (4mm) - diverticulosis and hemorrhoids also  5 year colon recall 10/2015 ______

## 2010-11-17 NOTE — Op Note (Signed)
[name], [name]                   ACCOUNT NO.:  [id number]   MEDICAL RECORD NO.:  [id number]          PATIENT TYPE:  AMB   LOCATION:  DSC                          FACILITY:  [facility]   PHYSICIAN:  Thomas A. Cornett, M.D.DATE OF BIRTH:  1956/06/17   DATE OF PROCEDURE:  11/22/2006  DATE OF DISCHARGE:                               OPERATIVE REPORT   PREOPERATIVE DIAGNOSIS:  History of breast cancer with right-sided Port-  [employer].   POSTOPERATIVE DIAGNOSIS:  History of breast cancer with right-sided Port-  [employer].   PROCEDURE:  Removal of right-sided Port-A-Cath.   SURGEON:  Maisie Fus A. Cornett, M.D.   ANESTHESIA:  MAC with 0.25% Sensorcaine , 5 mL.   DRAINS:  None.   INDICATIONS FOR PROCEDURE:  The patient a history of breast cancer.  She  is here to have her port removed.  Informed consent was obtained.   DESCRIPTION OF PROCEDURE:  The patient was brought to the operating room  and placed supine.  After induction of MAC anesthesia, the right  subclavian region was prepped and draped in sterile fashion.  I  infiltrated the old scar over the Port-A-Cath with 0.25% Sensorcaine  with epinephrine.   An incision was made through the old scar.  Dissection was carried down.  The ports was readily identified.  I grabbed the catheter tape and  removed it in its entirety, while keeping the finger over the catheter  tract.  A pursestring suture of 3-0 Vicryl was placed in the tract to  close it.  I then used cautery to remove the remainder of the Port-A-  Cath and sutures, and this was passed off the field.  The wound was  closed with a combination of 3-0 Vicryl and 4-0 Monocryl subcuticular  stitches.  Steri-Strips and dry dressings were applied.  All final  counts of sponge, needle, and instruments were found be correct at this  portion of the case.  An occlusive dressing was placed.   The patient was taken to recovery in satisfactory condition.      Thomas A. Cornett, M.D.  Electronically Signed     TAC/MEDQ  D:  11/22/2006  T:  11/22/2006  Job:  [id number]

## 2010-11-20 NOTE — Op Note (Signed)
NAMERAINNA, NEARHOOD                   ACCOUNT NO.:  [id number]   MEDICAL RECORD NO.:  [id number]          PATIENT TYPE:  AMB   LOCATION:  DSC                          FACILITY:  [facility]   PHYSICIAN:  Thomas A. Cornett, M.D.DATE OF BIRTH:  [birth year]   DATE OF PROCEDURE:  02/25/2006  DATE OF DISCHARGE:                                 OPERATIVE REPORT   PREOPERATIVE DIAGNOSIS:  Left breast cancer.   POSTOPERATIVE DIAGNOSIS:  Left breast cancer.   PROCEDURE:  1. Left breast needle localized lumpectomy.  2. Left breast sentinel lymph node mapping.  3. Placement of right subclavian Port-A-Cath with fluoroscopy.   SURGEON:  Dr. Harriette Bouillon.   ANESTHESIA:  LMA with 30 mL of 0.25% Sensorcaine local with epinephrine.   DRAINS:  None.   SPECIMEN:  1. Left breast lumpectomy site found to be clear by radiographic means and      touch prep.  2. Left axillary sentinel nodes x3 negative by touch prep.   INDICATIONS FOR PROCEDURE:  The patient is a 55 year old female with  recently diagnosed breast cancer by core biopsy.  She presents today for  breast conservation surgery, sentinel lymph node mapping  and placement of  Port-A-Cath.  The procedure was discussed with the patient as well as the  potential complications.  She voiced her understanding and agreed to  proceed.   DESCRIPTION OF PROCEDURE:  The patient was brought to the operating room  after undergoing injection by nuclear medicine and left breast needle  localization radiology.  She was brought to the operating room and placed  supine.  After induction of anesthesia, the right arm was tucked.  The upper  chest and neck bilaterally were prepped and draped in a sterile fashion.  The patient was placed in Trendelenburg.  A right subclavian approach to her  Port-A-Cath and this was done first.  I used a needle with the patient in  Trendelenburg and was able to access the right subclavian vein.  A wire was  fed through this and the  needle was removed.  Fluoroscopy was then used to  verify that the needle was in the right subclavian vein and coursed down the  inferior vena cava toward the right side of the heart.  Next, an incision  was made just below this using a scalpel and dissection was carried down to  the fascia of the pectoralis major.  A small pocket was created with my  finger.  I then brought an 8-French Bard X-port onto the field.  This was  flushed out and the port was flushed as well.  I then attached the catheter  to the port.  This was then tunneled from the lower incision to the upper  incision where the wire exited.  After this was done, I cut the catheter to  about 16 cm.  I then put the patient back in Trendelenburg and advanced a  dilator over the wire moving the wire to-and-fro with no resistance.  Next,  I placed a dilator introducer complex, slid this over the wire, moved the  wire to-and-fro and felt no resistance of this laid into place.  I then  removed the dilator and wire.  The catheter was placed in the peel-away  sheath and the peel-away sheath was peeled away.  I then used fluoroscopy  and the tip appeared to be in the superior vena cava just above the atrium.  This was then flushed and drew back nonpulsatile blood.  I secured the Port-  A-Cath to the chest wall with #0 Prolene.  Both wounds were closed with a  combination of 3-0 Vicryl and 4-0 Monocryl subcuticular stitch.  Steri-  Strips and dry dressings were applied.   Next, attention was turned to the left axilla.  NeoProbe was used and  baseline counts at the nipple were obtained where the injection sites were.  Counts of the subclavian and internal mammary regions were negative.  I was  able to identify a node in the level II region of the axilla.  The mass was  in the left upper lateral breast, so I made one curvilinear incision halfway  between the axilla and the mass.  I was able to dissect into the axilla with  this incision  and used a NeoProbe to identify 3 sentinel nodes in the level  I drainage area.  One was blue and hot, the other two were just hot and  these were removed.  These were sent to pathology for evaluation and touch  prep revealed these to be negative for metastatic disease.  Background  counts of the axilla were less than 20-30 which is well below the 10%  threshold.  Next attention was made to the mass.  The wire was identified  and dissection was carried circumferentially around the wire to get a nice  margin around the tumor.  This was sent for touch prep and the margins were  negative.  Irrigation was used and suctioned out.  Hemostasis was excellent  with cautery.  Of note, she did have significant fibrocystic change in that  left breast.  At this point in time, I closed the cavity with 3-0 Vicryl.  4-  0 Monocryl was used to close the skin.  Sterile dressings were applied.  All  final counts of sponge, needle and instruments were found to be correct at  this portion of the case.  Sterile dressings were applied.  The patient was  awoke, taken to recovery in satisfactory condition.  Chest x-ray will be  obtained in the recovery room.      Thomas A. Cornett, M.D.  Electronically Signed     TAC/MEDQ  D:  02/25/2006  T:  02/26/2006  Job:  [id number]   cc:   Dr. Rosalie Gums

## 2011-01-19 ENCOUNTER — Other Ambulatory Visit: Payer: Self-pay | Admitting: Oncology

## 2011-01-19 DIAGNOSIS — Z853 Personal history of malignant neoplasm of breast: Secondary | ICD-10-CM

## 2011-02-25 ENCOUNTER — Ambulatory Visit
Admission: RE | Admit: 2011-02-25 | Discharge: 2011-02-25 | Disposition: A | Discharge status: Home / Self Care | Payer: BC Managed Care – PPO | Source: Ambulatory Visit | Attending: Oncology | Admitting: Oncology

## 2011-02-25 ENCOUNTER — Encounter (HOSPITAL_BASED_OUTPATIENT_CLINIC_OR_DEPARTMENT_OTHER): Payer: BC Managed Care – PPO | Admitting: Oncology

## 2011-02-25 ENCOUNTER — Other Ambulatory Visit: Payer: Self-pay | Admitting: Oncology

## 2011-02-25 DIAGNOSIS — Z853 Personal history of malignant neoplasm of breast: Secondary | ICD-10-CM

## 2011-02-25 DIAGNOSIS — Z171 Estrogen receptor negative status [ER-]: Secondary | ICD-10-CM

## 2011-02-25 DIAGNOSIS — C50919 Malignant neoplasm of unspecified site of unspecified female breast: Secondary | ICD-10-CM

## 2011-02-25 LAB — CBC WITH DIFFERENTIAL/PLATELET
HCT: 37.2 % (ref 34.8–46.6)
MCHC: 35.2 g/dL (ref 31.5–36.0)
MONO#: 0.3 10*3/uL (ref 0.1–0.9)
NEUT%: 60.1 % (ref 38.4–76.8)
RBC: 3.87 10*6/uL (ref 3.70–5.45)
RDW: 12.4 % (ref 11.2–14.5)
WBC: 3.9 10*3/uL (ref 3.9–10.3)
lymph#: 1.2 10*3/uL (ref 0.9–3.3)

## 2011-02-26 LAB — COMPREHENSIVE METABOLIC PANEL
AST: 15 U/L (ref 0–37)
Albumin: 4.6 g/dL (ref 3.5–5.2)
Alkaline Phosphatase: 59 U/L (ref 39–117)
BUN: 13 mg/dL (ref 6–23)
Creatinine, Ser: 0.75 mg/dL (ref 0.50–1.10)
Glucose, Bld: 99 mg/dL (ref 70–99)
Total Bilirubin: 0.4 mg/dL (ref 0.3–1.2)

## 2011-02-26 LAB — VITAMIN D 25 HYDROXY (VIT D DEFICIENCY, FRACTURES): Vit D, 25-Hydroxy: 57 ng/mL (ref 30–89)

## 2011-03-04 ENCOUNTER — Encounter (HOSPITAL_BASED_OUTPATIENT_CLINIC_OR_DEPARTMENT_OTHER): Payer: BC Managed Care – PPO | Admitting: Oncology

## 2011-03-04 DIAGNOSIS — C50919 Malignant neoplasm of unspecified site of unspecified female breast: Secondary | ICD-10-CM

## 2011-03-04 DIAGNOSIS — Z171 Estrogen receptor negative status [ER-]: Secondary | ICD-10-CM

## 2012-01-20 ENCOUNTER — Other Ambulatory Visit: Payer: Self-pay | Admitting: Internal Medicine

## 2012-01-20 DIAGNOSIS — Z853 Personal history of malignant neoplasm of breast: Secondary | ICD-10-CM

## 2012-03-01 ENCOUNTER — Ambulatory Visit
Admission: RE | Admit: 2012-03-01 | Discharge: 2012-03-01 | Disposition: A | Discharge status: Home / Self Care | Payer: BC Managed Care – PPO | Source: Ambulatory Visit | Attending: Internal Medicine | Admitting: Internal Medicine

## 2012-03-01 DIAGNOSIS — Z853 Personal history of malignant neoplasm of breast: Secondary | ICD-10-CM

## 2013-03-29 ENCOUNTER — Encounter: Payer: Self-pay | Admitting: Obstetrics & Gynecology

## 2013-06-06 ENCOUNTER — Ambulatory Visit: Payer: Self-pay | Admitting: Obstetrics & Gynecology

## 2013-06-08 ENCOUNTER — Ambulatory Visit: Payer: Self-pay | Admitting: Obstetrics & Gynecology

## 2013-07-09 ENCOUNTER — Other Ambulatory Visit: Payer: Self-pay | Admitting: Obstetrics & Gynecology

## 2013-07-09 ENCOUNTER — Other Ambulatory Visit: Payer: Self-pay

## 2013-07-09 ENCOUNTER — Other Ambulatory Visit (HOSPITAL_COMMUNITY): Payer: Self-pay | Admitting: Radiology

## 2013-07-09 DIAGNOSIS — Z853 Personal history of malignant neoplasm of breast: Secondary | ICD-10-CM

## 2013-07-20 ENCOUNTER — Ambulatory Visit: Payer: Self-pay | Admitting: Obstetrics & Gynecology

## 2013-07-25 ENCOUNTER — Ambulatory Visit
Admission: RE | Admit: 2013-07-25 | Discharge: 2013-07-25 | Disposition: A | Discharge status: Home / Self Care | Payer: BC Managed Care – PPO | Source: Ambulatory Visit | Attending: Obstetrics & Gynecology | Admitting: Obstetrics & Gynecology

## 2013-07-25 DIAGNOSIS — Z853 Personal history of malignant neoplasm of breast: Secondary | ICD-10-CM

## 2014-07-11 ENCOUNTER — Other Ambulatory Visit: Payer: Self-pay

## 2014-07-11 DIAGNOSIS — Z1231 Encounter for screening mammogram for malignant neoplasm of breast: Secondary | ICD-10-CM

## 2014-07-31 ENCOUNTER — Ambulatory Visit
Admission: RE | Admit: 2014-07-31 | Discharge: 2014-07-31 | Disposition: A | Discharge status: Home / Self Care | Payer: 59 | Source: Ambulatory Visit

## 2014-07-31 DIAGNOSIS — Z1231 Encounter for screening mammogram for malignant neoplasm of breast: Secondary | ICD-10-CM

## 2015-07-11 ENCOUNTER — Other Ambulatory Visit: Payer: Self-pay

## 2015-07-11 DIAGNOSIS — Z1231 Encounter for screening mammogram for malignant neoplasm of breast: Secondary | ICD-10-CM

## 2015-08-06 ENCOUNTER — Ambulatory Visit
Admission: RE | Admit: 2015-08-06 | Discharge: 2015-08-06 | Disposition: A | Discharge status: Home / Self Care | Payer: BLUE CROSS/BLUE SHIELD | Source: Ambulatory Visit

## 2015-08-06 DIAGNOSIS — Z1231 Encounter for screening mammogram for malignant neoplasm of breast: Secondary | ICD-10-CM

## 2015-12-02 ENCOUNTER — Encounter: Payer: Self-pay | Admitting: Internal Medicine

## 2016-01-22 ENCOUNTER — Encounter: Payer: Self-pay | Admitting: Internal Medicine

## 2016-02-20 ENCOUNTER — Encounter: Payer: BLUE CROSS/BLUE SHIELD | Admitting: Internal Medicine

## 2016-04-22 ENCOUNTER — Encounter: Payer: BLUE CROSS/BLUE SHIELD | Admitting: Internal Medicine

## 2016-05-07 ENCOUNTER — Other Ambulatory Visit: Payer: Self-pay | Admitting: Family Medicine

## 2016-05-25 ENCOUNTER — Encounter: Payer: Self-pay | Admitting: Obstetrics & Gynecology

## 2016-05-25 ENCOUNTER — Ambulatory Visit (INDEPENDENT_AMBULATORY_CARE_PROVIDER_SITE_OTHER): Payer: BLUE CROSS/BLUE SHIELD | Admitting: Obstetrics & Gynecology

## 2016-05-25 VITALS — BP 110/66 | HR 76 | Resp 14 | Ht 65.5 in | Wt 143.0 lb

## 2016-05-25 DIAGNOSIS — C50919 Malignant neoplasm of unspecified site of unspecified female breast: Secondary | ICD-10-CM | POA: Insufficient documentation

## 2016-05-25 DIAGNOSIS — Z01419 Encounter for gynecological examination (general) (routine) without abnormal findings: Secondary | ICD-10-CM

## 2016-05-25 DIAGNOSIS — Z0000 Encounter for general adult medical examination without abnormal findings: Secondary | ICD-10-CM | POA: Diagnosis not present

## 2016-05-25 DIAGNOSIS — Z124 Encounter for screening for malignant neoplasm of cervix: Secondary | ICD-10-CM

## 2016-05-25 DIAGNOSIS — E2839 Other primary ovarian failure: Secondary | ICD-10-CM

## 2016-05-25 DIAGNOSIS — Z205 Contact with and (suspected) exposure to viral hepatitis: Secondary | ICD-10-CM

## 2016-05-25 LAB — POCT URINALYSIS DIPSTICK
Bilirubin, UA: NEGATIVE
Blood, UA: NEGATIVE
Glucose, UA: NEGATIVE
Ketones, UA: NEGATIVE
Leukocytes, UA: NEGATIVE
Nitrite, UA: NEGATIVE
PH UA: 6
PROTEIN UA: NEGATIVE
Urobilinogen, UA: NEGATIVE

## 2016-05-25 NOTE — Patient Instructions (Signed)
Don't forget to schedule your bone density with your mammogram.

## 2016-05-25 NOTE — Progress Notes (Signed)
60 y.o. G3P3 Divorced Caucasian F here for annual exam.  Doing well.  Oldest son getting married at [facility] this year.  Has her dress.  Has gotten a flu shot.    PCP:  Dr. Kathyrn Lass  No LMP recorded. Patient is postmenopausal.          Sexually active: No.  The current method of family planning is post menopausal status.    Exercising: Yes.    walking/ weights Smoker:  no  Health Maintenance: Pap:  ~3 years ago  History of abnormal Pap:  no MMG:  08/06/15 BIRADS 1 negative  Colonoscopy:  2/12 polyp- has one scheduled for 06/17/16  BMD:  Unsure of date  TDaP:  10/14  Pneumonia vaccine(s):  never Zostavax:   never Hep C testing: will do this today Screening Labs: PCP, Hb today: PCP, Urine today: normal    reports that she has never smoked. She has never used smokeless tobacco. She reports that she does not drink alcohol or use drugs.  Past Medical History:  Diagnosis Date  . Allergy   . Breast cancer, stage 1 ([place])   . Cancer ([place])   . Fibroids     Past Surgical History:  Procedure Laterality Date  . BREAST LUMPECTOMY     left side  . CESAREAN SECTION     twice  . thumb surg     Bilateral    Current Outpatient Prescriptions  Medication Sig Dispense Refill  . citalopram (CELEXA) 20 MG tablet   2  . fexofenadine (ALLEGRA) 180 MG tablet Take 90 mg by mouth daily. Take 1/2 tab daily     . Multiple Vitamin (MULTIVITAMIN) capsule Take 1 capsule by mouth daily.       No current facility-administered medications for this visit.     Family History  Problem Relation Age of Onset  . Heart attack Father     ROS:  Pertinent items are noted in HPI.  Otherwise, a comprehensive ROS was negative.  Exam:   BP 110/66 (BP Location: Right Arm, Patient Position: Sitting, Cuff Size: Normal)   Pulse 76   Resp 14   Ht 5' 5.5" (1.664 m)   Wt 143 lb (64.9 kg)   BMI 23.43 kg/m      Height: 5' 5.5" (166.4 cm)  Ht Readings from Last 3 Encounters:  05/25/16 5' 5.5" (1.664 m)   10/08/10 5\' 6"  (1.676 m)  09/29/10 5\' 6"  (1.676 m)    General appearance: alert, cooperative and appears stated age Head: Normocephalic, without obvious abnormality, atraumatic Neck: no adenopathy, supple, symmetrical, trachea midline and thyroid normal to inspection and palpation Lungs: clear to auscultation bilaterally Breasts: right breast without masses, skin changes, nipple discharge.  Left breast wtih well healed scar and radiation changes.  No LAD either side. Heart: regular rate and rhythm Abdomen: soft, non-tender; bowel sounds normal; no masses,  no organomegaly Extremities: extremities normal, atraumatic, no cyanosis or edema Skin: Skin color, texture, turgor normal. No rashes or lesions Lymph nodes: Cervical, supraclavicular, and axillary nodes normal. No abnormal inguinal nodes palpated Neurologic: Grossly normal   Pelvic: External genitalia:  no lesions              Urethra:  normal appearing urethra with no masses, tenderness or lesions              Bartholins and Skenes: normal                 Vagina: normal  appearing vagina with normal color and discharge, no lesions              Cervix: no lesions and atrophic appearance.  Almost flush at vaginal apex.              Pap taken: Yes.   Bimanual Exam:  Uterus:  normal size, contour, position, consistency, mobility, non-tender              Adnexa: normal adnexa and no mass, fullness, tenderness               Rectovaginal: Confirms               Anus:  normal sphincter tone, no lesions  Chaperone was present for exam.  A:  Well Woman with normal exam PMP, no HRT H/O stage 1 breast cancer H/o colon polyp  P:   Mammogram yearly.  pap smear and HR HPV obtained today Hep C antibody obtained today.  Remainder of lab work done with Dr. Sabra Heck Vaccines UTD.  Pt has gotten flu shot. Will do BMD with MMG next year.  Order placed. Colonoscopy is already scheduled before the end of the year return annually or prn

## 2016-05-26 LAB — HEPATITIS C ANTIBODY: HCV Ab: NEGATIVE

## 2016-05-31 LAB — IPS PAP TEST WITH HPV

## 2016-06-17 ENCOUNTER — Encounter: Payer: BLUE CROSS/BLUE SHIELD | Admitting: Internal Medicine

## 2016-06-21 ENCOUNTER — Telehealth: Payer: Self-pay | Admitting: *Deleted

## 2016-06-21 NOTE — Telephone Encounter (Signed)
Pt no show PV today. Called pt, no answer,left message for patient to call us back before 5 pm today to reschedule PV.

## 2016-06-22 ENCOUNTER — Ambulatory Visit (AMBULATORY_SURGERY_CENTER): Payer: Self-pay | Admitting: *Deleted

## 2016-06-22 VITALS — Ht 66.0 in | Wt 144.0 lb

## 2016-06-22 DIAGNOSIS — Z86010 Personal history of colonic polyps: Secondary | ICD-10-CM

## 2016-06-22 NOTE — Progress Notes (Signed)
No egg or soy allergy known to patient   issues with past sedation with any surgeries  or procedures of PONV , no intubation problems  No diet pills per patient No home 02 use per patient  No blood thinners per patient  Pt denies issues with constipation  No A fib or A flutter   

## 2016-07-01 ENCOUNTER — Ambulatory Visit (AMBULATORY_SURGERY_CENTER): Payer: BLUE CROSS/BLUE SHIELD | Admitting: Internal Medicine

## 2016-07-01 ENCOUNTER — Encounter: Payer: Self-pay | Admitting: Internal Medicine

## 2016-07-01 VITALS — BP 117/76 | HR 62 | Temp 96.4°F | Resp 11 | Ht 66.0 in | Wt 144.0 lb

## 2016-07-01 DIAGNOSIS — D122 Benign neoplasm of ascending colon: Secondary | ICD-10-CM | POA: Diagnosis not present

## 2016-07-01 DIAGNOSIS — Z86010 Personal history of colonic polyps: Secondary | ICD-10-CM

## 2016-07-01 MED ORDER — SODIUM CHLORIDE 0.9 % IV SOLN: 500.0000 mL | INTRAVENOUS | Status: AC

## 2016-07-01 NOTE — Patient Instructions (Addendum)
   I found and removed one small polyp that looks benign. I will let you know pathology results and when to have another routine colonoscopy by mail.  I appreciate the opportunity to care for you. Gatha Mayer, MD, FACG  YOU HAD AN ENDOSCOPIC PROCEDURE TODAY AT [facility]:   Refer to the procedure report that was given to you for any specific questions about what was found during the examination.  If the procedure report does not answer your questions, please call your gastroenterologist to clarify.  If you requested that your care partner not be given the details of your procedure findings, then the procedure report has been included in a sealed envelope for you to review at your convenience later.  YOU SHOULD EXPECT: Some feelings of bloating in the abdomen. Passage of more gas than usual.  Walking can help get rid of the air that was put into your GI tract during the procedure and reduce the bloating. If you had a lower endoscopy (such as a colonoscopy or flexible sigmoidoscopy) you may notice spotting of blood in your stool or on the toilet paper. If you underwent a bowel prep for your procedure, you may not have a normal bowel movement for a few days.  Please Note:  You might notice some irritation and congestion in your nose or some drainage.  This is from the oxygen used during your procedure.  There is no need for concern and it should clear up in a day or so.  SYMPTOMS TO REPORT IMMEDIATELY:   Following lower endoscopy (colonoscopy or flexible sigmoidoscopy):  Excessive amounts of blood in the stool  Significant tenderness or worsening of abdominal pains  Swelling of the abdomen that is new, acute  Fever of 100F or higher  For urgent or emergent issues, a gastroenterologist can be reached at any hour by calling [phone].   DIET:  We do recommend a small meal at first, but then you may proceed to your regular diet.  Drink plenty of fluids but you  should avoid alcoholic beverages for 24 hours.  ACTIVITY:  You should plan to take it easy for the rest of today and you should NOT DRIVE or use heavy machinery until tomorrow (because of the sedation medicines used during the test).    FOLLOW UP: Our staff will call the number listed on your records the next business day following your procedure to check on you and address any questions or concerns that you may have regarding the information given to you following your procedure. If we do not reach you, we will leave a message.  However, if you are feeling well and you are not experiencing any problems, there is no need to return our call.  We will assume that you have returned to your regular daily activities without incident.  If any biopsies were taken you will be contacted by phone or by letter within the next 1-3 weeks.  Please call us at [phone] if you have not heard about the biopsies in 3 weeks.    SIGNATURES/CONFIDENTIALITY: You and/or your care partner have signed paperwork which will be entered into your electronic medical record.  These signatures attest to the fact that that the information above on your After Visit Summary has been reviewed and is understood.  Full responsibility of the confidentiality of this discharge information lies with you and/or your care-partner.

## 2016-07-01 NOTE — Progress Notes (Signed)
Report to PACU, RN, vss, BBS= Clear.  

## 2016-07-01 NOTE — Op Note (Signed)
[facility] Patient Name: Jones Delaporte Procedure Date: 07/01/2016 8:36 AM MRN: [id number] Endoscopist: Gatha Mayer , MD Age: 59 Referring MD:  Date of Birth: 1956/05/30 Gender: Female Account #: [id number] Procedure:                Colonoscopy Indications:              Surveillance: Personal history of adenomatous                            polyps on last colonoscopy 5 years ago Medicines:                Propofol per Anesthesia, Monitored Anesthesia Care Procedure:                Pre-Anesthesia Assessment:                           - Prior to the procedure, a History and Physical                            was performed, and patient medications and                            allergies were reviewed. The patient's tolerance of                            previous anesthesia was also reviewed. The risks                            and benefits of the procedure and the sedation                            options and risks were discussed with the patient.                            All questions were answered, and informed consent                            was obtained. Prior Anticoagulants: The patient has                            taken no previous anticoagulant or antiplatelet                            agents. ASA Grade Assessment: II - A patient with                            mild systemic disease. After reviewing the risks                            and benefits, the patient was deemed in                            satisfactory condition to undergo the procedure.  After obtaining informed consent, the colonoscope                            was passed under direct vision. Throughout the                            procedure, the patient's blood pressure, pulse, and                            oxygen saturations were monitored continuously. The                            Model CF-HQ190L [id number]) scope was introduced   through the anus and advanced to the the cecum,                            identified by appendiceal orifice and ileocecal                            valve. The colonoscopy was performed without                            difficulty. The patient tolerated the procedure                            well. The quality of the bowel preparation was                            excellent. The bowel preparation used was Miralax.                            The ileocecal valve, appendiceal orifice, and                            rectum were photographed. Scope In: 8:51:10 AM Scope Out: 9:05:25 AM Scope Withdrawal Time: 0 hours 12 minutes 34 seconds  Total Procedure Duration: 0 hours 14 minutes 15 seconds  Findings:                 A 6 mm polyp was found in the ascending colon. The                            polyp was sessile. The polyp was removed with a                            cold snare. Resection and retrieval were complete.                            Verification of patient identification for the                            specimen was done. Estimated blood loss was minimal.  The exam was otherwise without abnormality on                            direct and retroflexion views. Complications:            No immediate complications. Estimated Blood Loss:     Estimated blood loss was minimal. Impression:               - One 6 mm polyp in the ascending colon, removed                            with a cold snare. Resected and retrieved.                           - The examination was otherwise normal on direct                            and retroflexion views.                           - Personal history of colonic polyps. 4 mm adenoma                            2012 Recommendation:           - Patient has a contact number available for                            emergencies. The signs and symptoms of potential                            delayed complications were discussed  with the                            patient. Return to normal activities tomorrow.                            Written discharge instructions were provided to the                            patient.                           - Resume previous diet.                           - Continue present medications.                           - Repeat colonoscopy is recommended for                            surveillance. The colonoscopy date will be                            determined after pathology results from today's  exam become available for review. Gatha Mayer, MD 07/01/2016 9:11:03 AM This report has been signed electronically.

## 2016-07-01 NOTE — Progress Notes (Signed)
Called to room to assist during endoscopic procedure.  Patient ID and intended procedure confirmed with present staff. Received instructions for my participation in the procedure from the performing physician.  

## 2016-07-02 ENCOUNTER — Telehealth: Payer: Self-pay | Admitting: *Deleted

## 2016-07-02 NOTE — Telephone Encounter (Signed)
  Follow up Call-  Call back number 07/01/2016  Post procedure Call Back phone  # [phone]  Permission to leave phone message Yes  Some recent data might be hidden     Patient questions:  Do you have a fever, pain , or abdominal swelling? No. Pain Score  0 *  Have you tolerated food without any problems? Yes.    Have you been able to return to your normal activities? Yes.    Do you have any questions about your discharge instructions: Diet   No. Medications  No. Follow up visit  No.  Do you have questions or concerns about your Care? No.  Actions: * If pain score is 4 or above: No action needed, pain <4.

## 2016-07-06 ENCOUNTER — Encounter: Payer: Self-pay | Admitting: Internal Medicine

## 2016-07-06 DIAGNOSIS — Z86010 Personal history of colonic polyps: Secondary | ICD-10-CM

## 2016-07-06 NOTE — Progress Notes (Signed)
Tubular adenoma Recall 5 yrs 06/2021

## 2016-07-13 ENCOUNTER — Other Ambulatory Visit: Payer: Self-pay | Admitting: Obstetrics & Gynecology

## 2016-07-13 DIAGNOSIS — Z1231 Encounter for screening mammogram for malignant neoplasm of breast: Secondary | ICD-10-CM

## 2016-07-20 ENCOUNTER — Encounter: Payer: BLUE CROSS/BLUE SHIELD | Admitting: Internal Medicine

## 2016-08-05 ENCOUNTER — Encounter: Payer: Self-pay | Admitting: Obstetrics & Gynecology

## 2016-08-06 ENCOUNTER — Ambulatory Visit
Admission: RE | Admit: 2016-08-06 | Discharge: 2016-08-06 | Disposition: A | Discharge status: Home / Self Care | Payer: BLUE CROSS/BLUE SHIELD | Source: Ambulatory Visit | Attending: Obstetrics & Gynecology | Admitting: Obstetrics & Gynecology

## 2016-08-06 DIAGNOSIS — E2839 Other primary ovarian failure: Secondary | ICD-10-CM

## 2016-08-06 DIAGNOSIS — Z1231 Encounter for screening mammogram for malignant neoplasm of breast: Secondary | ICD-10-CM

## 2016-08-06 DIAGNOSIS — Z13820 Encounter for screening for osteoporosis: Secondary | ICD-10-CM | POA: Diagnosis not present

## 2016-08-06 DIAGNOSIS — Z780 Asymptomatic menopausal state: Secondary | ICD-10-CM | POA: Diagnosis not present

## 2017-05-12 DIAGNOSIS — Z23 Encounter for immunization: Secondary | ICD-10-CM | POA: Diagnosis not present

## 2017-07-14 ENCOUNTER — Other Ambulatory Visit: Payer: Self-pay | Admitting: Obstetrics & Gynecology

## 2017-07-14 DIAGNOSIS — Z1231 Encounter for screening mammogram for malignant neoplasm of breast: Secondary | ICD-10-CM

## 2017-08-09 ENCOUNTER — Ambulatory Visit
Admission: RE | Admit: 2017-08-09 | Discharge: 2017-08-09 | Disposition: A | Discharge status: Home / Self Care | Payer: BLUE CROSS/BLUE SHIELD | Source: Ambulatory Visit | Attending: Obstetrics & Gynecology | Admitting: Obstetrics & Gynecology

## 2017-08-09 DIAGNOSIS — Z1231 Encounter for screening mammogram for malignant neoplasm of breast: Secondary | ICD-10-CM

## 2017-08-09 HISTORY — DX: Personal history of antineoplastic chemotherapy: Z92.21

## 2017-08-09 HISTORY — DX: Malignant neoplasm of unspecified site of unspecified female breast: C50.919

## 2017-08-09 HISTORY — DX: Personal history of irradiation: Z92.3

## 2017-11-08 ENCOUNTER — Other Ambulatory Visit: Payer: Self-pay | Admitting: Family Medicine

## 2017-11-08 DIAGNOSIS — L821 Other seborrheic keratosis: Secondary | ICD-10-CM | POA: Diagnosis not present

## 2018-01-22 DIAGNOSIS — H6123 Impacted cerumen, bilateral: Secondary | ICD-10-CM | POA: Diagnosis not present

## 2018-04-21 DIAGNOSIS — Z23 Encounter for immunization: Secondary | ICD-10-CM | POA: Diagnosis not present

## 2018-07-10 ENCOUNTER — Other Ambulatory Visit: Payer: Self-pay | Admitting: Obstetrics & Gynecology

## 2018-07-10 DIAGNOSIS — Z1231 Encounter for screening mammogram for malignant neoplasm of breast: Secondary | ICD-10-CM

## 2018-08-10 ENCOUNTER — Ambulatory Visit
Admission: RE | Admit: 2018-08-10 | Discharge: 2018-08-10 | Disposition: A | Discharge status: Home / Self Care | Payer: BLUE CROSS/BLUE SHIELD | Source: Ambulatory Visit | Attending: Obstetrics & Gynecology | Admitting: Obstetrics & Gynecology

## 2018-08-10 DIAGNOSIS — Z1231 Encounter for screening mammogram for malignant neoplasm of breast: Secondary | ICD-10-CM

## 2019-07-31 ENCOUNTER — Other Ambulatory Visit: Payer: Self-pay | Admitting: Obstetrics & Gynecology

## 2019-07-31 DIAGNOSIS — Z1231 Encounter for screening mammogram for malignant neoplasm of breast: Secondary | ICD-10-CM

## 2019-09-03 ENCOUNTER — Ambulatory Visit
Admission: RE | Admit: 2019-09-03 | Discharge: 2019-09-03 | Disposition: A | Discharge status: Home / Self Care | Payer: 59 | Source: Ambulatory Visit | Attending: Obstetrics & Gynecology | Admitting: Obstetrics & Gynecology

## 2019-09-03 ENCOUNTER — Other Ambulatory Visit: Payer: Self-pay

## 2019-09-03 DIAGNOSIS — Z1231 Encounter for screening mammogram for malignant neoplasm of breast: Secondary | ICD-10-CM

## 2020-07-29 ENCOUNTER — Other Ambulatory Visit: Payer: Self-pay | Admitting: Obstetrics & Gynecology

## 2020-07-29 DIAGNOSIS — Z1231 Encounter for screening mammogram for malignant neoplasm of breast: Secondary | ICD-10-CM

## 2020-09-10 ENCOUNTER — Ambulatory Visit
Admission: RE | Admit: 2020-09-10 | Discharge: 2020-09-10 | Disposition: A | Discharge status: Home / Self Care | Payer: 59 | Source: Ambulatory Visit | Attending: Obstetrics & Gynecology | Admitting: Obstetrics & Gynecology

## 2020-09-10 ENCOUNTER — Ambulatory Visit: Payer: 59

## 2020-09-10 ENCOUNTER — Other Ambulatory Visit: Payer: Self-pay

## 2020-09-10 DIAGNOSIS — Z1231 Encounter for screening mammogram for malignant neoplasm of breast: Secondary | ICD-10-CM

## 2021-07-08 ENCOUNTER — Encounter: Payer: Self-pay | Admitting: Internal Medicine

## 2021-08-11 ENCOUNTER — Other Ambulatory Visit: Payer: Self-pay | Admitting: Obstetrics & Gynecology

## 2021-08-11 DIAGNOSIS — Z1231 Encounter for screening mammogram for malignant neoplasm of breast: Secondary | ICD-10-CM

## 2021-08-13 DIAGNOSIS — R300 Dysuria: Secondary | ICD-10-CM | POA: Diagnosis not present

## 2021-08-18 DIAGNOSIS — R319 Hematuria, unspecified: Secondary | ICD-10-CM | POA: Diagnosis not present

## 2021-08-18 DIAGNOSIS — N390 Urinary tract infection, site not specified: Secondary | ICD-10-CM | POA: Diagnosis not present

## 2021-08-18 DIAGNOSIS — R300 Dysuria: Secondary | ICD-10-CM | POA: Diagnosis not present

## 2021-09-14 ENCOUNTER — Other Ambulatory Visit: Payer: Self-pay

## 2021-09-14 ENCOUNTER — Ambulatory Visit
Admission: RE | Admit: 2021-09-14 | Discharge: 2021-09-14 | Disposition: A | Discharge status: Home / Self Care | Payer: BC Managed Care – PPO | Source: Ambulatory Visit

## 2021-09-14 DIAGNOSIS — Z1231 Encounter for screening mammogram for malignant neoplasm of breast: Secondary | ICD-10-CM

## 2021-09-28 ENCOUNTER — Other Ambulatory Visit (HOSPITAL_COMMUNITY)
Admission: RE | Admit: 2021-09-28 | Discharge: 2021-09-28 | Disposition: A | Discharge status: Home / Self Care | Payer: BC Managed Care – PPO | Source: Ambulatory Visit | Attending: Obstetrics & Gynecology | Admitting: Obstetrics & Gynecology

## 2021-09-28 ENCOUNTER — Ambulatory Visit (INDEPENDENT_AMBULATORY_CARE_PROVIDER_SITE_OTHER): Payer: BC Managed Care – PPO | Admitting: Obstetrics & Gynecology

## 2021-09-28 ENCOUNTER — Other Ambulatory Visit: Payer: Self-pay

## 2021-09-28 ENCOUNTER — Encounter (HOSPITAL_BASED_OUTPATIENT_CLINIC_OR_DEPARTMENT_OTHER): Payer: Self-pay | Admitting: Obstetrics & Gynecology

## 2021-09-28 VITALS — BP 152/89 | HR 80 | Ht 65.0 in | Wt 146.0 lb

## 2021-09-28 DIAGNOSIS — Z86010 Personal history of colonic polyps: Secondary | ICD-10-CM

## 2021-09-28 DIAGNOSIS — N898 Other specified noninflammatory disorders of vagina: Secondary | ICD-10-CM | POA: Diagnosis not present

## 2021-09-28 DIAGNOSIS — Z124 Encounter for screening for malignant neoplasm of cervix: Secondary | ICD-10-CM | POA: Insufficient documentation

## 2021-09-28 DIAGNOSIS — Z853 Personal history of malignant neoplasm of breast: Secondary | ICD-10-CM | POA: Insufficient documentation

## 2021-09-28 DIAGNOSIS — Z01419 Encounter for gynecological examination (general) (routine) without abnormal findings: Secondary | ICD-10-CM | POA: Diagnosis not present

## 2021-09-28 NOTE — Progress Notes (Signed)
66 y.o. G3P3 Divorced White or Caucasian female here for annual exam/new patient exam.  She is a prior patient but I haven't seen her since 2017.   ? ?Reports she had a UTI that was treated with antibiotic.  She felt she had yeast after the treatment.  She treated with two OTC yeast vaginitis treatments.  She is still having some irritation/itching.   ? ?No LMP recorded. Patient is postmenopausal.          ?Sexually active: No.  ?The current method of family planning is post menopausal status.    ?Smoker:  no ? ?Health Maintenance: ?Pap:  05/25/2016 Negative ?History of abnormal Pap:  no ?MMG:  09/14/2021 Negative ?Colonoscopy:  07/01/2016, follow up 7 years ?BMD:   08/06/2016 Normal ?Screening Labs: Dr Kathyrn Lass ? ? reports that she has never smoked. She has never used smokeless tobacco. She reports current alcohol use. She reports that she does not use drugs. ? ?Past Medical History:  ?Diagnosis Date  ? Allergy   ? Breast cancer, stage 1 ([place]) 2007  ? left   ? Fibroids   ? Hx of adenomatous colonic polyps 10/05/2010  ? 10/2010 - diminutive adenoma 06/2016 - diminutive adenoma   ? Personal history of chemotherapy   ? Personal history of radiation therapy   ? ? ?Past Surgical History:  ?Procedure Laterality Date  ? BREAST LUMPECTOMY  2007  ? left side  ? [place]  ? last with BTL  ? POLYPECTOMY    ? TRIGGER FINGER RELEASE Bilateral   ? ? ?Current Outpatient Medications  ?Medication Sig Dispense Refill  ? cetirizine (ZYRTEC) 10 MG tablet Take 10 mg by mouth as needed for allergies.    ? ibuprofen (ADVIL,MOTRIN) 200 MG tablet Take 200 mg by mouth every 8 (eight) hours as needed.    ? Multiple Vitamin (MULTIVITAMIN) capsule Take 1 capsule by mouth daily.      ? Omega-3 Fatty Acids (FISH OIL) 1000 MG CAPS Take by mouth.    ? ?Current Facility-Administered Medications  ?Medication Dose Route Frequency Provider Last Rate Last Admin  ? 0.9 %  sodium chloride infusion  500 mL Intravenous Continuous  Gatha Mayer, MD      ? ? ?Family History  ?Problem Relation Age of Onset  ? Heart attack Father   ? Colon polyps Father   ? Colon cancer Neg Hx   ? Esophageal cancer Neg Hx   ? Rectal cancer Neg Hx   ? Stomach cancer Neg Hx   ? ? ?Review of Systems  ?Genitourinary:   ?     Vaginal irritation  ? ?Exam:   ?BP (!) 152/89 (BP Location: Right Arm, Patient Position: Sitting, Cuff Size: Normal)   Pulse 80   Ht '5\' 5"'$  (1.651 m)   Wt 146 lb (66.2 kg)   BMI 24.30 kg/m?   Height: '5\' 5"'$  (165.1 cm) ? ?General appearance: alert, cooperative and appears stated age ?Head: Normocephalic, without obvious abnormality, atraumatic ?Neck: no adenopathy, supple, symmetrical, trachea midline and thyroid normal to inspection and palpation ?Lungs: clear to auscultation bilaterally ?Breasts: normal appearance, no masses or tenderness ?Heart: regular rate and rhythm ?Abdomen: soft, non-tender; bowel sounds normal; no masses,  no organomegaly ?Extremities: extremities normal, atraumatic, no cyanosis or edema ?Skin: Skin color, texture, turgor normal. No rashes or lesions ?Lymph nodes: Cervical, supraclavicular, and axillary nodes normal. ?No abnormal inguinal nodes palpated ?Neurologic: Grossly normal ? ? ?Pelvic: External genitalia:  no  lesions ?             Urethra:  normal appearing urethra with no masses, tenderness or lesions ?             Bartholins and Skenes: normal    ?             Vagina: normal appearing vagina with normal color and no discharge, no lesions ?             Cervix: no lesions ?             Pap taken: No. ?Bimanual Exam:  Uterus:  normal size, contour, position, consistency, mobility, non-tender ?             Adnexa: normal adnexa and no mass, fullness, tenderness ?              Rectovaginal: Confirms ?              Anus:  normal sphincter tone, no lesions ? ?Chaperone, Octaviano Batty, CMA, was present for exam. ? ?Assessment/Plan: ?1. Well woman exam with routine gynecological exam ?- Pap and HR HPV obtained  today ?- MMG 09/2021 ?- colonoscopy 06/2016, follow up 7 years ?- BMD 2018, normal.  Plan to repeat next year ?- lab work done with Dr. Kathyrn Lass ?- vaccines reviewed/updated ? ?2. Cervical cancer screening ?- Cytology - PAP( [place]) ? ?3. Vaginal irritation ?- Cervicovaginal ancillary only( [place]) ?- discussed with pt if this is negative, would recommend trying vaginal Vit E cream or suppositories ? ?4. Hx of adenomatous colonic polyps ? ?5. Personal history of malignant neoplasm of breast ? ? ? ?

## 2021-09-29 LAB — CERVICOVAGINAL ANCILLARY ONLY
Bacterial Vaginitis (gardnerella): NEGATIVE
Candida Glabrata: NEGATIVE
Candida Vaginitis: NEGATIVE
Comment: NEGATIVE
Comment: NEGATIVE
Comment: NEGATIVE

## 2021-09-30 ENCOUNTER — Encounter (HOSPITAL_BASED_OUTPATIENT_CLINIC_OR_DEPARTMENT_OTHER): Payer: Self-pay | Admitting: Obstetrics & Gynecology

## 2021-09-30 LAB — CYTOLOGY - PAP
Comment: NEGATIVE
Diagnosis: NEGATIVE
High risk HPV: NEGATIVE

## 2021-10-01 MED ORDER — NONFORMULARY OR COMPOUNDED ITEM: 3 refills | Status: DC

## 2021-10-01 NOTE — Addendum Note (Signed)
Addended by: Megan Salon on: 10/01/2021 10:01 AM ? ? Modules accepted: Orders ? ?

## 2022-01-07 DIAGNOSIS — L819 Disorder of pigmentation, unspecified: Secondary | ICD-10-CM | POA: Diagnosis not present

## 2022-01-07 DIAGNOSIS — R030 Elevated blood-pressure reading, without diagnosis of hypertension: Secondary | ICD-10-CM | POA: Diagnosis not present

## 2022-01-07 DIAGNOSIS — L821 Other seborrheic keratosis: Secondary | ICD-10-CM | POA: Diagnosis not present

## 2022-03-30 DIAGNOSIS — Z0000 Encounter for general adult medical examination without abnormal findings: Secondary | ICD-10-CM | POA: Diagnosis not present

## 2022-03-30 DIAGNOSIS — Z853 Personal history of malignant neoplasm of breast: Secondary | ICD-10-CM | POA: Diagnosis not present

## 2022-03-30 DIAGNOSIS — Z13220 Encounter for screening for lipoid disorders: Secondary | ICD-10-CM | POA: Diagnosis not present

## 2022-08-30 ENCOUNTER — Other Ambulatory Visit: Payer: Self-pay | Admitting: Obstetrics & Gynecology

## 2022-08-30 DIAGNOSIS — Z1231 Encounter for screening mammogram for malignant neoplasm of breast: Secondary | ICD-10-CM

## 2022-10-01 ENCOUNTER — Ambulatory Visit (HOSPITAL_BASED_OUTPATIENT_CLINIC_OR_DEPARTMENT_OTHER): Payer: BC Managed Care – PPO | Admitting: Obstetrics & Gynecology

## 2022-10-14 ENCOUNTER — Ambulatory Visit
Admission: RE | Admit: 2022-10-14 | Discharge: 2022-10-14 | Disposition: A | Discharge status: Home / Self Care | Payer: Medicare Other | Source: Ambulatory Visit | Attending: Obstetrics & Gynecology | Admitting: Obstetrics & Gynecology

## 2022-10-14 DIAGNOSIS — Z1231 Encounter for screening mammogram for malignant neoplasm of breast: Secondary | ICD-10-CM

## 2022-10-15 ENCOUNTER — Encounter (HOSPITAL_BASED_OUTPATIENT_CLINIC_OR_DEPARTMENT_OTHER): Payer: Self-pay | Admitting: Obstetrics & Gynecology

## 2022-10-15 ENCOUNTER — Ambulatory Visit (INDEPENDENT_AMBULATORY_CARE_PROVIDER_SITE_OTHER): Payer: Medicare Other | Admitting: Obstetrics & Gynecology

## 2022-10-15 VITALS — BP 148/88 | HR 92 | Ht 65.5 in

## 2022-10-15 DIAGNOSIS — I10 Essential (primary) hypertension: Secondary | ICD-10-CM

## 2022-10-15 DIAGNOSIS — Z13820 Encounter for screening for osteoporosis: Secondary | ICD-10-CM | POA: Diagnosis not present

## 2022-10-15 DIAGNOSIS — N952 Postmenopausal atrophic vaginitis: Secondary | ICD-10-CM

## 2022-10-15 DIAGNOSIS — Z853 Personal history of malignant neoplasm of breast: Secondary | ICD-10-CM

## 2022-10-15 DIAGNOSIS — Z9189 Other specified personal risk factors, not elsewhere classified: Secondary | ICD-10-CM | POA: Diagnosis not present

## 2022-10-15 MED ORDER — NONFORMULARY OR COMPOUNDED ITEM: 3 refills | Status: DC

## 2022-10-15 NOTE — Progress Notes (Addendum)
67 y.o. G3P3 Divorced White or Caucasian female here for breast and pelvic exam.  I am also following her for postmenopausal status, vaginal dryness and hx of breast cancer.    Denies vaginal bleeding.  No LMP recorded. Patient is postmenopausal.          Sexually active: No.  H/O STD:  no  Health Maintenance: PCP:  Dr. Sigmund Hazel.  Last wellness appt was 03/2023.  Did blood work at that appt:  yes Vaccines are up to date:  yes Colonoscopy:  2017, follow up 7 years.   MMG:  done yesterday BMD:  08/06/2016.  Normal.   Last pap smear:  2023 H/o abnormal pap smear:  no    reports that she has never smoked. She has never used smokeless tobacco. She reports current alcohol use. She reports that she does not use drugs.  Past Medical History:  Diagnosis Date   Allergy    Breast cancer    Breast cancer, stage 1 2007   left    Fibroids    Hx of adenomatous colonic polyps 10/05/2010   10/2010 - diminutive adenoma 06/2016 - diminutive adenoma    Personal history of chemotherapy    Personal history of radiation therapy     Past Surgical History:  Procedure Laterality Date   BREAST LUMPECTOMY  2007   left side   CESAREAN SECTION  1990, 1996   last with BTL   POLYPECTOMY     TRIGGER FINGER RELEASE Bilateral     Current Outpatient Medications  Medication Sig Dispense Refill   cetirizine (ZYRTEC) 10 MG tablet Take 10 mg by mouth as needed for allergies.     ibuprofen (ADVIL,MOTRIN) 200 MG tablet Take 200 mg by mouth every 8 (eight) hours as needed.     magnesium citrate solution Take 296 mLs by mouth once.     Multiple Vitamin (MULTIVITAMIN) capsule Take 1 capsule by mouth daily.       Omega-3 Fatty Acids (FISH OIL) 1000 MG CAPS Take by mouth.     NONFORMULARY OR COMPOUNDED ITEM Vitamin E vaginal suppositories 200u/ml.  One pv three times weekly. 36 each 3   Current Facility-Administered Medications  Medication Dose Route Frequency Provider Last Rate Last Admin   0.9 %  sodium  chloride infusion  500 mL Intravenous Continuous Iva Boop, MD        Family History  Problem Relation Age of Onset   Heart attack Father    Colon polyps Father    Colon cancer Neg Hx    Esophageal cancer Neg Hx    Rectal cancer Neg Hx    Stomach cancer Neg Hx     Review of Systems  Constitutional: Negative.   Genitourinary: Negative.     Exam:   BP (!) 148/88 (BP Location: Right Arm, Patient Position: Sitting, Cuff Size: Normal)   Pulse 92   Ht 5' 5.5" (1.664 m) Comment: reported  BMI 23.93 kg/m   Height: 5' 5.5" (166.4 cm) (reported)  General appearance: alert, cooperative and appears stated age Breasts: normal appearance, no masses or tenderness, well healed scars on left breast Abdomen: soft, non-tender; bowel sounds normal; no masses,  no organomegaly Lymph nodes: Cervical, supraclavicular, and axillary nodes normal.  No abnormal inguinal nodes palpated Neurologic: Grossly normal  Pelvic: External genitalia:  no lesions              Urethra:  normal appearing urethra with no masses, tenderness or lesions  Bartholins and Skenes: normal                 Vagina: normal appearing vagina with atrophic changes and no discharge, no lesions              Cervix: no lesions              Pap taken: No. Bimanual Exam:  Uterus:  normal size, contour, position, consistency, mobility, non-tender              Adnexa: normal adnexa and no mass, fullness, tenderness               Rectovaginal: Confirms               Anus:  normal sphincter tone, no lesions  Chaperone, Ina Homes, CMA, was present for exam.  Assessment/Plan: 1. GYN exam for high-risk Medicare patient - Pap smear neg 2023 - Mammogram done yesterday - Colonoscopy 2017.  Due this year - Bone mineral density ordered - lab work done with PCP, Dr. Sigmund Hazel - vaccines reviewed/updated  2. White coat syndrome with diagnosis of hypertension - pt is going to monitor at home  3. Vaginal atrophy -  NONFORMULARY OR COMPOUNDED ITEM; Vitamin E vaginal suppositories 200u/ml.  One pv three times weekly.  Dispense: 36 each; Refill: 3  4. Personal history of malignant neoplasm of breast  5. Osteoporosis screening - DG BONE DENSITY (DXA); Future

## 2023-05-18 DIAGNOSIS — H43821 Vitreomacular adhesion, right eye: Secondary | ICD-10-CM | POA: Diagnosis not present

## 2023-05-18 DIAGNOSIS — H2513 Age-related nuclear cataract, bilateral: Secondary | ICD-10-CM | POA: Diagnosis not present

## 2023-05-18 DIAGNOSIS — H35342 Macular cyst, hole, or pseudohole, left eye: Secondary | ICD-10-CM | POA: Diagnosis not present

## 2023-05-19 ENCOUNTER — Encounter (HOSPITAL_BASED_OUTPATIENT_CLINIC_OR_DEPARTMENT_OTHER): Payer: Self-pay | Admitting: Obstetrics & Gynecology

## 2023-05-19 ENCOUNTER — Other Ambulatory Visit (HOSPITAL_BASED_OUTPATIENT_CLINIC_OR_DEPARTMENT_OTHER): Payer: Self-pay | Admitting: Obstetrics & Gynecology

## 2023-05-19 DIAGNOSIS — Z13820 Encounter for screening for osteoporosis: Secondary | ICD-10-CM

## 2023-05-20 ENCOUNTER — Other Ambulatory Visit: Payer: Medicare Other

## 2023-05-26 DIAGNOSIS — Z23 Encounter for immunization: Secondary | ICD-10-CM | POA: Diagnosis not present

## 2023-06-06 DIAGNOSIS — H35342 Macular cyst, hole, or pseudohole, left eye: Secondary | ICD-10-CM | POA: Diagnosis not present

## 2023-06-06 DIAGNOSIS — H33312 Horseshoe tear of retina without detachment, left eye: Secondary | ICD-10-CM | POA: Diagnosis not present

## 2023-06-14 DIAGNOSIS — H43821 Vitreomacular adhesion, right eye: Secondary | ICD-10-CM | POA: Diagnosis not present

## 2023-06-14 DIAGNOSIS — H35342 Macular cyst, hole, or pseudohole, left eye: Secondary | ICD-10-CM | POA: Diagnosis not present

## 2023-07-05 DIAGNOSIS — H35342 Macular cyst, hole, or pseudohole, left eye: Secondary | ICD-10-CM | POA: Diagnosis not present

## 2023-07-26 DIAGNOSIS — H43821 Vitreomacular adhesion, right eye: Secondary | ICD-10-CM | POA: Diagnosis not present

## 2023-07-26 DIAGNOSIS — H35341 Macular cyst, hole, or pseudohole, right eye: Secondary | ICD-10-CM | POA: Diagnosis not present

## 2023-07-26 DIAGNOSIS — H33312 Horseshoe tear of retina without detachment, left eye: Secondary | ICD-10-CM | POA: Diagnosis not present

## 2023-07-26 DIAGNOSIS — H4312 Vitreous hemorrhage, left eye: Secondary | ICD-10-CM | POA: Diagnosis not present

## 2023-07-26 DIAGNOSIS — H2513 Age-related nuclear cataract, bilateral: Secondary | ICD-10-CM | POA: Diagnosis not present

## 2023-07-26 DIAGNOSIS — H31092 Other chorioretinal scars, left eye: Secondary | ICD-10-CM | POA: Diagnosis not present

## 2023-08-31 ENCOUNTER — Other Ambulatory Visit: Payer: Self-pay | Admitting: Obstetrics & Gynecology

## 2023-08-31 DIAGNOSIS — Z1231 Encounter for screening mammogram for malignant neoplasm of breast: Secondary | ICD-10-CM

## 2023-09-19 ENCOUNTER — Encounter: Payer: Self-pay | Admitting: Internal Medicine

## 2023-10-17 ENCOUNTER — Ambulatory Visit
Admission: RE | Admit: 2023-10-17 | Discharge: 2023-10-17 | Disposition: A | Discharge status: Home / Self Care | Payer: Medicare Other | Source: Ambulatory Visit | Attending: Obstetrics & Gynecology | Admitting: Obstetrics & Gynecology

## 2023-10-17 DIAGNOSIS — Z1231 Encounter for screening mammogram for malignant neoplasm of breast: Secondary | ICD-10-CM | POA: Diagnosis not present

## 2023-10-27 ENCOUNTER — Other Ambulatory Visit (HOSPITAL_BASED_OUTPATIENT_CLINIC_OR_DEPARTMENT_OTHER): Payer: Self-pay | Admitting: Obstetrics & Gynecology

## 2023-10-27 DIAGNOSIS — N952 Postmenopausal atrophic vaginitis: Secondary | ICD-10-CM

## 2023-10-27 MED ORDER — NONFORMULARY OR COMPOUNDED ITEM
3 refills | Status: AC
Start: 2023-10-27 — End: ?

## 2023-11-03 DIAGNOSIS — L249 Irritant contact dermatitis, unspecified cause: Secondary | ICD-10-CM | POA: Diagnosis not present

## 2023-11-03 DIAGNOSIS — L814 Other melanin hyperpigmentation: Secondary | ICD-10-CM | POA: Diagnosis not present

## 2023-11-03 DIAGNOSIS — L821 Other seborrheic keratosis: Secondary | ICD-10-CM | POA: Diagnosis not present

## 2023-12-05 ENCOUNTER — Encounter

## 2023-12-12 ENCOUNTER — Encounter (HOSPITAL_BASED_OUTPATIENT_CLINIC_OR_DEPARTMENT_OTHER): Payer: Self-pay | Admitting: Obstetrics & Gynecology

## 2023-12-12 ENCOUNTER — Ambulatory Visit (HOSPITAL_BASED_OUTPATIENT_CLINIC_OR_DEPARTMENT_OTHER): Payer: Medicare Other | Admitting: Obstetrics & Gynecology

## 2023-12-12 VITALS — BP 153/91 | HR 81 | Ht 65.5 in | Wt 141.2 lb

## 2023-12-12 DIAGNOSIS — N952 Postmenopausal atrophic vaginitis: Secondary | ICD-10-CM

## 2023-12-12 DIAGNOSIS — Z853 Personal history of malignant neoplasm of breast: Secondary | ICD-10-CM

## 2023-12-12 DIAGNOSIS — Z23 Encounter for immunization: Secondary | ICD-10-CM | POA: Diagnosis not present

## 2023-12-12 DIAGNOSIS — Z860101 Personal history of adenomatous and serrated colon polyps: Secondary | ICD-10-CM | POA: Diagnosis not present

## 2023-12-12 DIAGNOSIS — Z9189 Other specified personal risk factors, not elsewhere classified: Secondary | ICD-10-CM

## 2023-12-12 DIAGNOSIS — Z0000 Encounter for general adult medical examination without abnormal findings: Secondary | ICD-10-CM | POA: Diagnosis not present

## 2023-12-12 DIAGNOSIS — Z86018 Personal history of other benign neoplasm: Secondary | ICD-10-CM | POA: Diagnosis not present

## 2023-12-12 DIAGNOSIS — Z136 Encounter for screening for cardiovascular disorders: Secondary | ICD-10-CM | POA: Diagnosis not present

## 2023-12-12 NOTE — Progress Notes (Signed)
   Breast and Pelvic Patient name: Karen Graham MRN [id number]  Date of birth: August 22, 1955 Chief Complaint:   Breast and Pelvic  History of Present Illness:   Karen Graham is a 68 y.o. G3P3 Caucasian female being seen today for breast and pelvic exam.  Denies vaginal bleeding.    No LMP recorded. Patient is postmenopausal.  Last pap 2023.  Results were: negative. H/O abnormal pap: no Last mammogram: 10/17/2023. Results were: normal. Personal hx of breast cancer.   Last colonoscopy: scheduled 01/09/2024.  DEXA:  scheduled next week.       12/12/2023    1:31 PM 10/15/2022    9:15 AM 09/28/2021    3:35 PM  Depression screen PHQ 2/9  Decreased Interest 0 0 0  Down, Depressed, Hopeless 0 0 0  PHQ - 2 Score 0 0 0     Review of Systems:   Pertinent items are noted in HPI Denies any headaches, blurred vision, fatigue, shortness of breath, chest pain, abdominal pain, abnormal vaginal discharge/itching/odor/irritation, problems with periods, bowel movements, urination, or intercourse unless otherwise stated above. Pertinent History Reviewed:  Reviewed past medical,surgical, social and family history.  Reviewed problem list, medications and allergies. Physical Assessment:   Vitals:   12/12/23 1326  BP: (!) 153/91  Pulse: 81  Weight: 141 lb 3.2 oz (64 kg)  Height: 5' 5.5" (1.664 m)  Body mass index is 23.14 kg/m.        Physical Examination:   General appearance - well appearing, and in no distress  Mental status - alert, oriented to person, place, and time  Psych:  She has a normal mood and affect  Skin - warm and dry, normal color, no suspicious lesions noted  Chest - effort normal, all lung fields clear to auscultation bilaterally  Heart - normal rate and regular rhythm  Neck:  midline trachea, no thyromegaly or nodules  Breasts - left breast with well healed scar in UOQ, stable radiation changes, no LAD, right breast is soft, no masses, skin changes, no LAD, no nipple  discharge  Abdomen - soft, nontender, nondistended, no masses or organomegaly  Pelvic - VULVA: normal appearing vulva with no masses, tenderness or lesions   VAGINA: vaginal atrophy, no lesions  CERVIX: normal appearing cervix without discharge or lesions, no CMT  Thin prep pap is not indicated  UTERUS: uterus is felt to be normal size, shape, consistency and nontender   ADNEXA: No adnexal masses or tenderness noted.  Rectal - normal rectal, good sphincter tone, no masses felt.   Extremities:  No swelling or varicosities noted  Chaperone present for exam  Assessment & Plan:  1. GYN exam for high-risk Medicare patient (Primary) - Pap smear 2023 - Mammogram 10/2022 - Colonoscopy scheduled for this summer - Bone mineral density scheduled - lab work done with PCP, Dr. Perley Bradley.  She has appt this afternoon. - vaccines reviewed/updated.  Declines   2. Hx of adenomatous colonic polyps  3. Personal history of malignant neoplasm of breast  4. Vaginal atrophy - using vit E vaginal suppositories.  RF done 10/2023.     No orders of the defined types were placed in this encounter.   Meds: No orders of the defined types were placed in this encounter.   Follow-up: Return in about 1 year (around 12/11/2024).  Lillian Rein, MD 12/12/2023 2:19 PM

## 2023-12-13 LAB — LAB REPORT - SCANNED: EGFR: 95

## 2023-12-19 ENCOUNTER — Encounter: Admitting: Internal Medicine

## 2023-12-23 ENCOUNTER — Encounter

## 2023-12-23 ENCOUNTER — Telehealth: Payer: Self-pay

## 2023-12-23 NOTE — Telephone Encounter (Signed)
 Patient has rescheduled previsit to 12/26/23 @ 0830.

## 2023-12-23 NOTE — Telephone Encounter (Signed)
 Multiple telephone attempts made to complete pre-visit phone appointment, all unsuccessful.  Pt was informed on last message which was left to please call back by the end of business day today and reschedule previsit or her colonoscopy would be canceled.

## 2023-12-26 ENCOUNTER — Telehealth: Payer: Self-pay

## 2023-12-26 ENCOUNTER — Encounter

## 2023-12-26 NOTE — Telephone Encounter (Signed)
 Multiple attempts made to reach patient to complete pre-visit, telephone appointment, all unsuccessful.  Message left at 0840 instructing patient to please call back before 5pm today to reschedule previsit appointment.  She was informed via phone message if previsit was not rescheduled, colonoscopy on 7/7 would be cancelled per [facility] policy.

## 2023-12-27 NOTE — Telephone Encounter (Signed)
 Patient did not call back and reschedule pre visit, so colonoscopy on 01/09/24 has been cancelled and no show letter has been mailed.

## 2023-12-29 ENCOUNTER — Encounter: Admitting: Internal Medicine

## 2024-01-05 ENCOUNTER — Other Ambulatory Visit: Payer: Medicare Other

## 2024-01-09 ENCOUNTER — Encounter: Admitting: Internal Medicine

## 2024-01-23 DIAGNOSIS — H2513 Age-related nuclear cataract, bilateral: Secondary | ICD-10-CM | POA: Diagnosis not present

## 2024-01-23 DIAGNOSIS — H31092 Other chorioretinal scars, left eye: Secondary | ICD-10-CM | POA: Diagnosis not present

## 2024-01-23 DIAGNOSIS — H43821 Vitreomacular adhesion, right eye: Secondary | ICD-10-CM | POA: Diagnosis not present

## 2024-01-23 DIAGNOSIS — H35341 Macular cyst, hole, or pseudohole, right eye: Secondary | ICD-10-CM | POA: Diagnosis not present

## 2024-01-26 ENCOUNTER — Ambulatory Visit (HOSPITAL_BASED_OUTPATIENT_CLINIC_OR_DEPARTMENT_OTHER)
Admission: RE | Admit: 2024-01-26 | Discharge: 2024-01-26 | Disposition: A | Discharge status: Home / Self Care | Source: Ambulatory Visit | Attending: Obstetrics & Gynecology | Admitting: Obstetrics & Gynecology

## 2024-01-26 DIAGNOSIS — Z13820 Encounter for screening for osteoporosis: Secondary | ICD-10-CM | POA: Diagnosis not present

## 2024-01-26 DIAGNOSIS — Z780 Asymptomatic menopausal state: Secondary | ICD-10-CM | POA: Insufficient documentation

## 2024-01-31 ENCOUNTER — Ambulatory Visit (HOSPITAL_BASED_OUTPATIENT_CLINIC_OR_DEPARTMENT_OTHER): Payer: Self-pay | Admitting: Obstetrics & Gynecology

## 2024-03-29 DIAGNOSIS — Z860100 Personal history of colon polyps, unspecified: Secondary | ICD-10-CM | POA: Diagnosis not present

## 2024-03-29 DIAGNOSIS — Z1211 Encounter for screening for malignant neoplasm of colon: Secondary | ICD-10-CM | POA: Diagnosis not present

## 2024-03-29 DIAGNOSIS — K635 Polyp of colon: Secondary | ICD-10-CM | POA: Diagnosis not present

## 2024-04-23 ENCOUNTER — Emergency Department (HOSPITAL_COMMUNITY)

## 2024-04-23 ENCOUNTER — Telehealth: Payer: Self-pay | Admitting: Internal Medicine

## 2024-04-23 ENCOUNTER — Emergency Department (HOSPITAL_COMMUNITY)
Admission: EM | Admit: 2024-04-23 | Discharge: 2024-04-23 | Disposition: A | Discharge status: Home / Self Care | Attending: Emergency Medicine | Admitting: Emergency Medicine

## 2024-04-23 DIAGNOSIS — R0789 Other chest pain: Secondary | ICD-10-CM | POA: Insufficient documentation

## 2024-04-23 DIAGNOSIS — R079 Chest pain, unspecified: Secondary | ICD-10-CM | POA: Diagnosis not present

## 2024-04-23 DIAGNOSIS — R404 Transient alteration of awareness: Secondary | ICD-10-CM | POA: Diagnosis not present

## 2024-04-23 DIAGNOSIS — I2510 Atherosclerotic heart disease of native coronary artery without angina pectoris: Secondary | ICD-10-CM | POA: Diagnosis not present

## 2024-04-23 DIAGNOSIS — Z853 Personal history of malignant neoplasm of breast: Secondary | ICD-10-CM | POA: Insufficient documentation

## 2024-04-23 DIAGNOSIS — R072 Precordial pain: Secondary | ICD-10-CM | POA: Diagnosis present

## 2024-04-23 LAB — BASIC METABOLIC PANEL WITH GFR
Anion gap: 12 (ref 5–15)
BUN: 10 mg/dL (ref 8–23)
CO2: 22 mmol/L (ref 22–32)
Calcium: 8.9 mg/dL (ref 8.9–10.3)
Chloride: 102 mmol/L (ref 98–111)
Creatinine, Ser: 0.69 mg/dL (ref 0.44–1.00)
GFR, Estimated: >60 mL/min (ref >60)
Glucose, Bld: 125 mg/dL — ABNORMAL HIGH (ref 70–99)
Potassium: 3.3 mmol/L — ABNORMAL LOW (ref 3.5–5.1)
Sodium: 136 mmol/L (ref 135–145)

## 2024-04-23 LAB — CBC WITH DIFFERENTIAL/PLATELET
Abs Immature Granulocytes: 0.01 K/uL (ref 0.00–0.07)
Basophils Absolute: 0 K/uL (ref 0.0–0.1)
Basophils Relative: 1 %
Eosinophils Absolute: 0 K/uL (ref 0.0–0.5)
Eosinophils Relative: 0 %
HCT: 37.7 % (ref 36.0–46.0)
Hemoglobin: 13.1 g/dL (ref 12.0–15.0)
Immature Granulocytes: 0 %
Lymphocytes Relative: 25 %
Lymphs Abs: 0.9 K/uL (ref 0.7–4.0)
MCH: 32.8 pg (ref 26.0–34.0)
MCHC: 34.7 g/dL (ref 30.0–36.0)
MCV: 94.5 fL (ref 80.0–100.0)
Monocytes Absolute: 0.3 K/uL (ref 0.1–1.0)
Monocytes Relative: 7 %
Neutro Abs: 2.4 K/uL (ref 1.7–7.7)
Neutrophils Relative %: 67 %
Platelets: 231 K/uL (ref 150–400)
RBC: 3.99 MIL/uL (ref 3.87–5.11)
RDW: 11.9 % (ref 11.5–15.5)
WBC: 3.6 K/uL — ABNORMAL LOW (ref 4.0–10.5)
nRBC: 0 % (ref 0.0–0.2)

## 2024-04-23 LAB — TROPONIN I (HIGH SENSITIVITY)
Troponin I (High Sensitivity): 6 ng/L (ref <18)
Troponin I (High Sensitivity): 10 ng/L (ref <18)

## 2024-04-23 LAB — MAGNESIUM: Magnesium: 1.6 mg/dL — ABNORMAL LOW (ref 1.7–2.4)

## 2024-04-23 MED ORDER — MAGNESIUM OXIDE -MG SUPPLEMENT 400 (240 MG) MG PO TABS
400.0000 mg | ORAL_TABLET | Freq: Once | ORAL | Status: AC
  Administered 2024-04-23: 400 mg via ORAL
  Filled 2024-04-23: qty 1

## 2024-04-23 MED ORDER — NITROGLYCERIN 0.4 MG SL SUBL
0.4000 mg | SUBLINGUAL_TABLET | SUBLINGUAL | Status: DC | PRN
  Administered 2024-04-23: 0.4 mg via SUBLINGUAL
  Filled 2024-04-23: qty 1

## 2024-04-23 MED ORDER — POTASSIUM CHLORIDE CRYS ER 20 MEQ PO TBCR: 20.0000 meq | EXTENDED_RELEASE_TABLET | Freq: Every day | ORAL | 0 refills | Status: AC

## 2024-04-23 MED ORDER — POTASSIUM CHLORIDE CRYS ER 20 MEQ PO TBCR
40.0000 meq | EXTENDED_RELEASE_TABLET | Freq: Once | ORAL | Status: AC
  Administered 2024-04-23: 40 meq via ORAL
  Filled 2024-04-23: qty 2

## 2024-04-23 MED ORDER — MAGNESIUM OXIDE 400 MG PO TABS: 400.0000 mg | ORAL_TABLET | Freq: Every day | ORAL | 0 refills | Status: AC

## 2024-04-23 NOTE — Telephone Encounter (Signed)
 Felt fine waking up this morning 6:20 Sitting having coffee   Around 6:45 Developed tightness in chest   Progressive over the next 20 min EMS called    Come in waves    Had another tightness in chest   Like needed to burp but never happened Took 4 baby aspirin    Pain 8 to 2    ON road to hospital had another severe tightness Gave NTG   Went to 2/10     Took NTG in ER   BP came down and HR came down   Happened 1 other time about 6 to 8 months ago  Burped   Went away   Father died suddenly at 12  MGF died in sleep  Mother had HTN and hardening of arteres but lived to 95  NoSOB with activiy   Walks all the time   goes to gym   [employer]  Only had reflux with second pregnancy NO problems swallowing food  EKG   SR 88 bpm   Now wiped out emotionally   No symptoms  BP was high in ER        REcomm:   EcASA 81 mg  Will set clinic appt this week   OK to add on    Told her to come in at 11 am this Thursday   10/23

## 2024-04-23 NOTE — ED Notes (Signed)
 Patient ambulatory to restroom, independently, with steady gait.

## 2024-04-23 NOTE — ED Provider Notes (Signed)
 [place] EMERGENCY DEPARTMENT AT [facility] Provider Note   CSN: [id number] Arrival date & time: 04/23/24  9189     Patient presents with: Chest Pain   Karen Graham is a 68 y.o. female.   68 year old female presents today for concern of chest pain that started this morning.  This started around 6:30 AM after she woke up and she was sitting down having coffee.  Pain was substernal.  She had some shortness of breath associated with it however she denies diaphoresis, nausea, or radiation of the pain.  No prior history of CAD that she is aware of.  Not currently on any medications.  Has history of breast cancer in remission.  Does have history of CAD in her family including her dad.  States her pain did improve after 1 dose of nitroglycerin however the pain is starting to come back.  The history is provided by the patient. No language interpreter was used.       Prior to Admission medications   Medication Sig Start Date End Date Taking? Authorizing Provider  cetirizine (ZYRTEC) 10 MG tablet Take 10 mg by mouth as needed for allergies.    [provider]  ibuprofen (ADVIL,MOTRIN) 200 MG tablet Take 200 mg by mouth every 8 (eight) hours as needed.    [provider]  magnesium citrate solution Take 296 mLs by mouth once.    [provider]  Multiple Vitamin (MULTIVITAMIN) capsule Take 1 capsule by mouth daily.      [provider]  NONFORMULARY OR COMPOUNDED ITEM Vitamin E vaginal suppositories 200u/ml.  One pv three times weekly. 10/27/23   Cleotilde Ronal RAMAN, MD  Omega-3 Fatty Acids (FISH OIL) 1000 MG CAPS Take by mouth.    [provider]    Allergies: Patient has no known allergies.    Review of Systems  Constitutional:  Negative for chills and fever.  Respiratory:  Positive for shortness of breath. Negative for cough.   Cardiovascular:  Positive for chest pain. Negative for palpitations and leg swelling.  Gastrointestinal:   Negative for nausea and vomiting.  Neurological:  Negative for light-headedness.  All other systems reviewed and are negative.   Updated Vital Signs BP 119/79   Pulse 98   Temp 97.8 F (36.6 C)   Ht 5' 6 (1.676 m)   Wt 59 kg   BMI 20.98 kg/m   Physical Exam Vitals and nursing note reviewed.  Constitutional:      General: She is not in acute distress.    Appearance: Normal appearance. She is not ill-appearing.  HENT:     Head: Normocephalic and atraumatic.     Nose: Nose normal.  Eyes:     Conjunctiva/sclera: Conjunctivae normal.  Cardiovascular:     Rate and Rhythm: Normal rate and regular rhythm.  Pulmonary:     Effort: Pulmonary effort is normal. No respiratory distress.  Abdominal:     General: There is no distension.     Palpations: Abdomen is soft.     Tenderness: There is no abdominal tenderness.  Musculoskeletal:        General: No deformity. Normal range of motion.     Cervical back: Normal range of motion.     Right lower leg: No edema.     Left lower leg: No edema.  Skin:    Findings: No rash.  Neurological:     Mental Status: She is alert.     (all labs ordered are listed,  but only abnormal results are displayed) Labs Reviewed  BASIC METABOLIC PANEL WITH GFR  CBC WITH DIFFERENTIAL/PLATELET  MAGNESIUM  TROPONIN I (HIGH SENSITIVITY)    EKG: EKG Interpretation Date/Time:  Monday April 23 2024 08:27:58 EDT Ventricular Rate:  88 PR Interval:  180 QRS Duration:  92 QT Interval:  364 QTC Calculation: 441 R Axis:   64  Text Interpretation: Sinus rhythm Low voltage, precordial leads No significant change since last tracing Confirmed by Dean Clarity [phone]) on 04/23/2024 8:32:40 AM  Radiology: No results found.   Procedures   Medications Ordered in the ED  nitroGLYCERIN (NITROSTAT) SL tablet 0.4 mg (0.4 mg Sublingual Given 04/23/24 0910)    Clinical Course as of 04/23/24 1235  Mon Apr 23, 2024  1209 CBC without acute concern.   Troponin negative x 2.  BMP with potassium of 3.3, magnesium 1.6.  P.o. repletion ordered.  She does drink multiple cups of coffee per day.  Likely the source of her electrolyte loss.  Discussed cutting back on these and will give her daily supplement for the next 5 days.  She will have this rechecked by her PCP.  Shared decision making had.  Patient feels comfortable with discharge and close follow-up with cardiology.  Strict return precautions discussed.  Her sons are at bedside and they are in agreement with plan as well.  Discussed with attending as well. [AA]    Clinical Course User Index [AA] Hildegard Loge, PA-C                                 Medical Decision Making Amount and/or Complexity of Data Reviewed Labs: ordered. Radiology: ordered.  Risk OTC drugs. Prescription drug management.   Medical Decision Making / ED Course   This patient presents to the ED for concern of chest pain, this involves an extensive number of treatment options, and is a complaint that carries with it a high risk of complications and morbidity.  The differential diagnosis includes ACS, PE, pneumonia, GERD, dissection, MSK pain  MDM: 68 year old female presents with above-mentioned complaint. Initially did appear that the nitroglycerin might have been helping her pain however she later explained that her pain was waxing and waning all on its own even before the nitroglycerin so it is likely that the pain improved coincidentally at the same time that nitroglycerin was given. She has moderate heart score.  Negative troponin x 2. Shared decision making had and patient feels comfortable going home with close cardiology follow-up.  Patient discharged in stable condition.  Strict return precaution discussed.  At the time of discharge she is chest pain-free.  She ambulated to go to the bathroom and had remained chest pain-free and without any other complaints.  Otherwise hemodynamically stable.   Additional  history obtained: -Additional history obtained from sons at bedside -External records from outside source obtained and reviewed including: Chart review including previous notes, labs, imaging, consultation notes   Lab Tests: -I ordered, reviewed, and interpreted labs.   The pertinent results include:   Labs Reviewed  BASIC METABOLIC PANEL WITH GFR  CBC WITH DIFFERENTIAL/PLATELET  MAGNESIUM  TROPONIN I (HIGH SENSITIVITY)      EKG  EKG Interpretation Date/Time:  Monday April 23 2024 08:27:58 EDT Ventricular Rate:  88 PR Interval:  180 QRS Duration:  92 QT Interval:  364 QTC Calculation: 441 R Axis:   64  Text Interpretation: Sinus rhythm Low voltage, precordial leads  No significant change since last tracing Confirmed by Dean Clarity [phone]) on 04/23/2024 8:32:40 AM         Imaging Studies ordered: I ordered imaging studies including chest x-ray I independently visualized and interpreted imaging. I agree with the radiologist interpretation   Medicines ordered and prescription drug management: Meds ordered this encounter  Medications   nitroGLYCERIN (NITROSTAT) SL tablet 0.4 mg    -I have reviewed the patients home medicines and have made adjustments as needed  Cardiac Monitoring: The patient was maintained on a cardiac monitor.  I personally viewed and interpreted the cardiac monitored which showed an underlying rhythm of: Normal sinus rhythm  Reevaluation: After the interventions noted above, I reevaluated the patient and found that they have :improved  Co morbidities that complicate the patient evaluation  Past Medical History:  Diagnosis Date   Allergy    Breast cancer, stage 1 (HCC) 2007   left    Fibroids    Hx of adenomatous colonic polyps 10/05/2010   10/2010 - diminutive adenoma 06/2016 - diminutive adenoma    Personal history of chemotherapy    Personal history of radiation therapy       Dispostion: Discharged in stable condition.  Return  precaution discussed.  Patient voices understanding and is in agreement with the plan.   Final diagnoses:  Atypical chest pain    ED Discharge Orders          Ordered    magnesium oxide (MAG-OX) 400 MG tablet  Daily        04/23/24 1209    potassium chloride SA (KLOR-CON M) 20 MEQ tablet  Daily        04/23/24 1209    Ambulatory referral to Cardiology       Comments: If you have not heard from the Cardiology office within the next 72 hours please call [phone].   04/23/24 1212               Hildegard Loge, PA-C 04/23/24 1238    Dean Clarity, MD 04/23/24 1321

## 2024-04-23 NOTE — ED Triage Notes (Signed)
 Patient presents to the ER via EMS from home with c/o sudden onset of chest pain. Patient was given 325mg  ASA and Nitroglycerin x1 that brought her pain from an 8 down to a 2. Patient is awake, alert and able to answer questions appropriately.

## 2024-04-23 NOTE — ED Notes (Signed)
 Patient discharged to home with family. Patient given written and oral discharge instructions. Patient verbalized understanding. Patient ambulatory to exit with steady gait. Patient breathing without difficulty. Patient denies questions, concerns or needs at this time.

## 2024-04-23 NOTE — Discharge Instructions (Signed)
 Follow-up with cardiology office.  I have sent in a referral however you can call and schedule this appointment as well.  If you do not call they should be calling you in 2 business days.  Blood work was reassuring in the emergency department.  No indication for heart attack or other emergent cause.  Return for any emergent symptoms.  I recommend for the next week that you cut back on your caffeine intake.  Limit yourself to 1 cup of coffee a day and drink increased fluids.  Have your primary care doctor recheck your electrolytes and 5 days to ensure the magnesium and potassium improved.

## 2024-04-24 ENCOUNTER — Encounter (HOSPITAL_BASED_OUTPATIENT_CLINIC_OR_DEPARTMENT_OTHER): Payer: Self-pay

## 2024-04-24 MED ORDER — ASPIRIN 81 MG PO TBEC: 81.0000 mg | DELAYED_RELEASE_TABLET | Freq: Every day | ORAL | Status: AC

## 2024-04-24 NOTE — Addendum Note (Signed)
 Addended by: CHAUVIGNE, Lathan Gieselman on: 04/24/2024 03:51 PM   Modules accepted: Orders

## 2024-04-24 NOTE — Telephone Encounter (Signed)
 ASA has been added to patient's list. Appointment has been made for 10/23 at 11:00am with Dr. Okey

## 2024-04-26 ENCOUNTER — Ambulatory Visit: Attending: Internal Medicine | Admitting: Internal Medicine

## 2024-04-26 ENCOUNTER — Encounter: Payer: Self-pay | Admitting: Internal Medicine

## 2024-04-26 VITALS — BP 150/90 | HR 83 | Ht 65.5 in | Wt 142.3 lb

## 2024-04-26 DIAGNOSIS — R079 Chest pain, unspecified: Secondary | ICD-10-CM

## 2024-04-26 MED ORDER — PANTOPRAZOLE SODIUM 40 MG PO TBEC: 40.0000 mg | DELAYED_RELEASE_TABLET | Freq: Every day | ORAL | 1 refills | Status: AC

## 2024-04-26 NOTE — Patient Instructions (Signed)
 Medication Instructions:  Your physician has recommended you make the following change in your medication:   1) START pantoprazole (Protonix) 40 mg once daily for 2 months  *If you need a refill on your cardiac medications before your next appointment, please call your pharmacy*  Follow-Up: At [facility], you and your health needs are our priority.  As part of our continuing mission to provide you with exceptional heart care, our providers are all part of one team.  This team includes your primary Cardiologist (physician) and Advanced Practice Providers or APPs (Physician Assistants and Nurse Practitioners) who all work together to provide you with the care you need, when you need it.  Your next appointment:   As needed  Provider:   Vina Gull, MD   We recommend signing up for the patient portal called MyChart.  Sign up information is provided on this After Visit Summary.  MyChart is used to connect with patients for Virtual Visits (Telemedicine).  Patients are able to view lab/test results, encounter notes, upcoming appointments, etc.  Non-urgent messages can be sent to your provider as well.    To learn more about what you can do with MyChart, go to [email or web address].

## 2024-04-26 NOTE — Progress Notes (Signed)
 Cardiology Office Note   Date:  04/26/2024   ID:  Karen Graham, DOB 09/10/1955, MRN [id number]  PCP:  Cleotilde Planas, MD  Cardiologist:   Vina Gull, MD   Patient presents for evaluation of CP     History of Present Illness: Karen Graham is a 68 y.o. female with a no known Hx of CAD  She woke up Monday morning at 6:20  (04/23/24) and felt find   Had a cup of coffee    Was sitting drinkng coffee  At 6:45 developed chest tightness   Never had before   Progressive over next 20 min   Walked around house with no relief She Called EMS   Chest discomfort came in  waves    In EMS truck had another severe spell   Felt lke needed to burp   Never happened   She was given 4 baby aspirin   Pain went from 8 to 2      Then severe again   Was given NTG SL   Pain went to 2     In ER given another NTG     BP high on arrival   Came down    EKG in ER done (NSR 88 bpm)   Trop negative   Sent home   Prior to Monday was doing good    Walking   Going to gym to lift weights   NO SOB with activity    After ER visit was fatigued, on Monday  Said she felt wiped out   No further pain that day   Tues felt much better   Slitght tightness in morning  but went away  Worked 1/2 day  Tired Yesterday (Wed) felt  like brand new me   Up/down stairs   Hauling stuff back/forth    Not wiped out    Today felt mild tightness   DId normal routine  Went for walk with dog  Brisk  Up/down hills   No SOB   Walkded fast for about 1 mile  No change /worsening of minimal discomfort     Has been burping      Hx of reflux during second pregnancy  None since  THis discomfort was different  No problems swallowing food   Admits to drinking seltzer water at night    Father died suddenly at 3  MGF died in sleep  Mother had HTN and hardening of arteres but lived to 95            Current Meds  Medication Sig   aspirin EC 81 MG tablet Take 1 tablet (81 mg total) by mouth daily. Swallow whole.   cetirizine (ZYRTEC) 10 MG  tablet Take 10 mg by mouth as needed for allergies.   ibuprofen (ADVIL,MOTRIN) 200 MG tablet Take 200 mg by mouth every 8 (eight) hours as needed.   magnesium oxide (MAG-OX) 400 MG tablet Take 1 tablet (400 mg total) by mouth daily for 5 days.   Multiple Vitamin (MULTIVITAMIN) capsule Take 1 capsule by mouth daily.     NONFORMULARY OR COMPOUNDED ITEM Vitamin E vaginal suppositories 200u/ml.  One pv three times weekly.   Omega-3 Fatty Acids (FISH OIL) 1000 MG CAPS Take by mouth.   pantoprazole (PROTONIX) 40 MG tablet Take 1 tablet (40 mg total) by mouth daily.   potassium chloride SA (KLOR-CON M) 20 MEQ tablet Take 1 tablet (20 mEq total) by mouth daily.   Current Facility-Administered Medications for the  04/26/24 encounter (Office Visit) with Okey Vina GAILS, MD  Medication   0.9 %  sodium chloride  infusion     Allergies:   Patient has no known allergies.   Past Medical History:  Diagnosis Date   Allergy    Breast cancer, stage 1 (HCC) 2007   left    Fibroids    Hx of adenomatous colonic polyps 10/05/2010   10/2010 - diminutive adenoma 06/2016 - diminutive adenoma    Personal history of chemotherapy    Personal history of radiation therapy     Past Surgical History:  Procedure Laterality Date   BREAST LUMPECTOMY  2007   left side   CESAREAN SECTION  1990, 1996   last with BTL   POLYPECTOMY     TRIGGER FINGER RELEASE Bilateral      Social History:  The patient  reports that she has never smoked. She has never used smokeless tobacco. She reports current alcohol use. She reports that she does not use drugs.   Family History:  The patient's family history includes Colon polyps in her father; Heart attack in her father.    ROS:  Please see the history of present illness. All other systems are reviewed and  Negative to the above problem except as noted.    PHYSICAL EXAM: VS:  BP (!) 150/90   Pulse 83   Ht 5' 5.5 (1.664 m)   Wt 142 lb 4.8 oz (64.5 kg)   SpO2 97%   BMI  23.32 kg/m   BP on recheck 134/74   GEN: Well nourished, well developed, in no acute distress  HEENT: normal  Neck: no JVD, carotid bruits, Cardiac: RRR; no murmurs Respiratory:  clear to auscultation bilaterally,  GI: soft, nontender, no masses No hepatomegaly  MS: no deformity Moving all extremities   Ext  No edema   2+ pulses   EKG:  EKG is not ordered today.   Lipid Panel No results found for: CHOL, TRIG, HDL, CHOLHDL, VLDL, LDLCALC, LDLDIRECT    Wt Readings from Last 3 Encounters:  04/26/24 142 lb 4.8 oz (64.5 kg)  04/23/24 130 lb (59 kg)  12/12/23 141 lb 3.2 oz (64 kg)      ASSESSMENT AND PLAN:  1  Chest pain   Atypical    Yesterday she was up/down stairs without problem      QUestion if pain GI     I would recomm trial of Protonix for a few weeks along with some dietary adjustments to avoid GI upset I have asked pt to write back/contact me with response If does not improve consider RUQ USN to evaluate GB Keep on ecASA for now  2  HTN BP was high in ER and high initially here   She describes some white coat effect I have asked her to follow at home  GOal below 130/90  3  LIpdis  LDL was 160 in June 2025  Trig 70  HDL 72   Will get all labs from Dr Cleotilde    Reviewed diet    Consider Ca score CT to further risk stratify given lipids and FHx  Follow up / testing based on response     Current medicines are reviewed at length with the patient today.  The patient does not have concerns regarding medicines.  Signed, Vina Okey, MD  04/26/2024 9:24 PM    [facility] [address] [place], [place], [place]  [address] Phone: [phone]; Fax: [phone]

## 2024-05-02 ENCOUNTER — Other Ambulatory Visit: Payer: Self-pay | Admitting: Internal Medicine

## 2024-05-03 NOTE — Telephone Encounter (Signed)
 Pt of Dr. Okey. These Rx's were not prescribed or refilled by Dr. Okey. Does Dr. Okey want to refill? Please advise.

## 2024-06-11 ENCOUNTER — Telehealth: Payer: Self-pay | Admitting: Internal Medicine

## 2024-06-11 DIAGNOSIS — Z13220 Encounter for screening for lipoid disorders: Secondary | ICD-10-CM

## 2024-06-11 DIAGNOSIS — Z131 Encounter for screening for diabetes mellitus: Secondary | ICD-10-CM

## 2024-06-11 NOTE — Telephone Encounter (Signed)
 See previous phone note Forgot to route  Pt doing good     No CP   Changed diet   Lots of veggies  Cut out caffeine and seltzers    Sleeping good    Recomm:  NMR panel, Apo B, Lpa and A1C in March at [facility][

## 2024-06-11 NOTE — Telephone Encounter (Signed)
 Called patient   She is feeling good    Sympotms resolved Cut out coffee, seltzers  Eats some meat BP at home   120s/70s    PT feeling good   Sleeping good   Recom:  Have orders for patient to go to [facility] in March for  Lpa, NMR panel, Apo B and A1C

## 2024-06-12 NOTE — Addendum Note (Signed)
 Addended by: VICCI ROXIE CROME on: 06/12/2024 08:34 AM   Modules accepted: Orders

## 2024-06-12 NOTE — Telephone Encounter (Signed)
Labs ordered, My Chart message sent to patient.

## 2024-12-24 ENCOUNTER — Ambulatory Visit (HOSPITAL_BASED_OUTPATIENT_CLINIC_OR_DEPARTMENT_OTHER): Admitting: Obstetrics & Gynecology
